# Patient Record
Sex: Male | Born: 1945 | Race: White | Hispanic: No | Marital: Married | Smoking: Former smoker
Health system: Southern US, Community
[De-identification: ages and names within clinical notes are randomized; demographics above are authoritative.]

## PROBLEM LIST (undated history)

## (undated) DIAGNOSIS — M1990 Unspecified osteoarthritis, unspecified site: Secondary | ICD-10-CM

## (undated) DIAGNOSIS — N390 Urinary tract infection, site not specified: Secondary | ICD-10-CM

## (undated) DIAGNOSIS — N401 Enlarged prostate with lower urinary tract symptoms: Secondary | ICD-10-CM

## (undated) DIAGNOSIS — D696 Thrombocytopenia, unspecified: Secondary | ICD-10-CM

## (undated) DIAGNOSIS — R338 Other retention of urine: Secondary | ICD-10-CM

## (undated) DIAGNOSIS — T7840XA Allergy, unspecified, initial encounter: Secondary | ICD-10-CM

## (undated) HISTORY — PX: VASECTOMY: SHX75

## (undated) HISTORY — PX: HERNIA REPAIR: SHX51

## (undated) HISTORY — PX: CYSTOSCOPY: SHX5120

## (undated) HISTORY — DX: Allergy, unspecified, initial encounter: T78.40XA

## (undated) HISTORY — PX: TONSILLECTOMY: SUR1361

---

## 2012-01-28 NOTE — H&P (Signed)
Joshua Golden DOB: 02/11/1946   Chief Complaint: right shoulder pain  History of Present Illness The patient is a 66 year old male who is scheduled for a right rotator cuff repair with Dr. Darrelyn Hillock on Wednesday February 05, 2012. He reportspain at the right shoulder that began 2 month ago . The patient reports that the shoulder symptoms began following a specific injury. The injury resulted from a fall onto the shoulder (backwards after slipping on steps). He was working in his house when he fell back on to his shoulders after slipping on some steps.The patient reports symptoms which include shoulder pain, decreased range of motion, night pain and inability to lay on that side. The patient reports symptoms that radiate to the right upper arm. The patient describes these symptoms as moderate in severity. Symptoms are exacerbated by motion at the shoulder and lifting. MRI showed full thickness tear of the subscapularis and supraspinatus tendons.   Allergies No Known Drug Allergies. 01/15/2012   Family History Drug / Alcohol Addiction. father Rheumatoid Arthritis. mother   Social History Alcohol use. current drinker; drinks beer and wine; only occasionally per week Children. 4 Current work status. retired Financial planner (Currently). no Drug/Alcohol Rehab (Previously). no Exercise. Exercises weekly; does running / walking Illicit drug use. no Living situation. live with spouse Marital status. married Number of flights of stairs before winded. greater than 5 Pain Contract. no Tobacco / smoke exposure. no Tobacco use. former smoker; smoke(d) less than 1/2 pack(s) per day   Medication History No Current Medications.   Past Surgical History Inguinal Hernia Repair. open: left   Review of Systems General:Not Present- Chills, Fever, Night Sweats, Appetite Loss, Fatigue, Feeling sick, Weight Gain and Weight Loss. Skin:Not Present- Itching,  Rash, Skin Color Changes, Ulcer, Psoriasis and Change in Hair or Nails. HEENT:Not Present- Sensitivity to light, Hearing problems, Nose Bleed and Ringing in the Ears. Neck:Not Present- Swollen Glands and Neck Mass. Respiratory:Not Present- Snoring, Chronic Cough, Bloody sputum and Dyspnea. Cardiovascular:Not Present- Shortness of Breath, Chest Pain, Swelling of Extremities, Leg Cramps and Palpitations. Gastrointestinal:Not Present- Bloody Stool, Heartburn, Abdominal Pain, Vomiting, Nausea and Incontinence of Stool. Male Genitourinary:Not Present- Blood in Urine, Frequency, Incontinence and Nocturia. Musculoskeletal:Not Present- Muscle Weakness, Muscle Pain, Joint Stiffness, Joint Swelling, Joint Pain and Back Pain. Neurological:Present- Tingling and Numbness. Not Present- Burning, Tremor, Headaches and Dizziness. Psychiatric:Not Present- Anxiety, Depression and Memory Loss. Endocrine:Not Present- Cold Intolerance, Heat Intolerance, Excessive hunger and Excessive Thirst. Hematology:Not Present- Abnormal Bleeding, Anemia, Blood Clots and Easy Bruising.   Vitals 01/15/2012 3:01 PM Weight: 181 lb Height: 68 in Body Surface Area: 1.98 m Body Mass Index: 27.52 kg/m Pulse: 84 (Regular) BP: 171/97 (Sitting, Left Arm, Standard)   Physical Exam On exam, he is alert and oriented x3. He is in no acute distress. There is tenderness to palpation over the anterior portion of the shoulder. The [facility] joint is intact. There are no masses or tumors palpated. He does have pain with motion, flexion and abduction of his right shoulder. He is unable to flex his right shoulder above about 45 degrees. The same with abduction. He is not able to hold his right arm up against resistance. The left shoulder has full painless range of motion and normal strength. Sensation and circulation is intact in the upper extremities.heart sounds normal. No murmurs. Lungs clear to auscultation. Abdomen soft  and nontender. Bowel sounds active. Neck supple, no bruit. EOM intact. Neuro grossly intact.   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    Assessment & Plan Rotator Cuff Tear Open rotator cuff repair, right shoulder

## 2012-01-30 ENCOUNTER — Encounter (HOSPITAL_COMMUNITY): Payer: Self-pay | Admitting: Pharmacy Technician

## 2012-02-03 ENCOUNTER — Encounter (HOSPITAL_COMMUNITY): Payer: Self-pay

## 2012-02-03 ENCOUNTER — Encounter (HOSPITAL_COMMUNITY)
Admission: RE | Admit: 2012-02-03 | Discharge: 2012-02-03 | Disposition: A | Discharge status: Home / Self Care | Payer: BC Managed Care – PPO | Source: Ambulatory Visit | Attending: Orthopedic Surgery | Admitting: Orthopedic Surgery

## 2012-02-03 HISTORY — DX: Thrombocytopenia, unspecified: D69.6

## 2012-02-03 LAB — COMPREHENSIVE METABOLIC PANEL
ALT: 10 U/L (ref 0–53)
AST: 17 U/L (ref 0–37)
Albumin: 4 g/dL (ref 3.5–5.2)
Alkaline Phosphatase: 67 U/L (ref 39–117)
BUN: 15 mg/dL (ref 6–23)
CO2: 27 mEq/L (ref 19–32)
Calcium: 9.5 mg/dL (ref 8.4–10.5)
Chloride: 105 mEq/L (ref 96–112)
Creatinine, Ser: 1.15 mg/dL (ref 0.50–1.35)
GFR calc Af Amer: 76 mL/min — ABNORMAL LOW (ref >90)
GFR calc non Af Amer: 65 mL/min — ABNORMAL LOW (ref >90)
Glucose, Bld: 88 mg/dL (ref 70–99)
Potassium: 4.4 mEq/L (ref 3.5–5.1)
Sodium: 139 mEq/L (ref 135–145)
Total Bilirubin: 0.4 mg/dL (ref 0.3–1.2)
Total Protein: 7.5 g/dL (ref 6.0–8.3)

## 2012-02-03 LAB — PROTIME-INR
INR: 0.91 (ref 0.00–1.49)
Prothrombin Time: 12.4 seconds (ref 11.6–15.2)

## 2012-02-03 LAB — URINALYSIS, ROUTINE W REFLEX MICROSCOPIC
Bilirubin Urine: NEGATIVE
Glucose, UA: NEGATIVE mg/dL
Hgb urine dipstick: NEGATIVE
Ketones, ur: NEGATIVE mg/dL
Leukocytes, UA: NEGATIVE
Nitrite: NEGATIVE
Protein, ur: NEGATIVE mg/dL
Specific Gravity, Urine: 1.023 (ref 1.005–1.030)
Urobilinogen, UA: 0.2 mg/dL (ref 0.0–1.0)
pH: 6 (ref 5.0–8.0)

## 2012-02-03 LAB — CBC
HCT: 47.5 % (ref 39.0–52.0)
Hemoglobin: 16.8 g/dL (ref 13.0–17.0)
MCH: 30.4 pg (ref 26.0–34.0)
MCHC: 35.4 g/dL (ref 30.0–36.0)
MCV: 85.9 fL (ref 78.0–100.0)
Platelets: 180 10*3/uL (ref 150–400)
RBC: 5.53 MIL/uL (ref 4.22–5.81)
RDW: 12.6 % (ref 11.5–15.5)
WBC: 6.4 10*3/uL (ref 4.0–10.5)

## 2012-02-03 LAB — DIFFERENTIAL
Basophils Absolute: 0 10*3/uL (ref 0.0–0.1)
Basophils Relative: 0 % (ref 0–1)
Eosinophils Absolute: 0.1 10*3/uL (ref 0.0–0.7)
Eosinophils Relative: 1 % (ref 0–5)
Lymphocytes Relative: 37 % (ref 12–46)
Lymphs Abs: 2.4 10*3/uL (ref 0.7–4.0)
Monocytes Absolute: 0.5 10*3/uL (ref 0.1–1.0)
Monocytes Relative: 8 % (ref 3–12)
Neutro Abs: 3.5 10*3/uL (ref 1.7–7.7)
Neutrophils Relative %: 54 % (ref 43–77)

## 2012-02-03 LAB — APTT: aPTT: 35 seconds (ref 24–37)

## 2012-02-03 LAB — SURGICAL PCR SCREEN: MRSA, PCR: NEGATIVE

## 2012-02-03 MED ORDER — CEFAZOLIN SODIUM 1-5 GM-% IV SOLN: 1.0000 g | INTRAVENOUS | Status: DC

## 2012-02-03 NOTE — Patient Instructions (Addendum)
20 Joshua Golden  02/03/2012   Your procedure is scheduled on:  02/05/12 100pm-236pm  Report to [facility] at 1100 AM.  Call this number if you have problems the morning of surgery: [phone]   Remember:   Do not eat food:After Midnight.  May have clear liquids:0700 am then npo     Take these medicines the morning of surgery with A SIP OF WATER:    Do not wear jewelry,   Do not wear lotions, powders, or perfumes.   .  Do not bring valuables to the hospital.  Contacts, dentures or bridgework may not be worn into surgery.  Leave suitcase in the car. After surgery it may be brought to your room.  For patients admitted to the hospital, checkout time is 11:00 AM the day of discharge.    :   Special Instructions: CHG Shower Use Special Wash: 1/2 bottle night before surgery and 1/2 bottle morning of surgery. shower chin to toes with CHG.  Wash face and private parts with regular soap.    Please read over the following fact sheets that you were given: MRSA Information, coughing and deep breathing exercises, leg exercises , Blood Transfusion Fact Sheet, Incentive Spirometry Fact Sheet

## 2012-02-05 ENCOUNTER — Encounter (HOSPITAL_COMMUNITY): Payer: Self-pay | Admitting: Anesthesiology

## 2012-02-05 ENCOUNTER — Encounter (HOSPITAL_COMMUNITY)
Admission: RE | Disposition: A | Discharge status: Home / Self Care | Payer: Self-pay | Source: Ambulatory Visit | Attending: Orthopedic Surgery

## 2012-02-05 ENCOUNTER — Encounter (HOSPITAL_COMMUNITY): Payer: Self-pay | Admitting: *Deleted

## 2012-02-05 ENCOUNTER — Ambulatory Visit (HOSPITAL_COMMUNITY)
Admission: RE | Admit: 2012-02-05 | Discharge: 2012-02-06 | Disposition: A | Discharge status: Home / Self Care | Payer: BC Managed Care – PPO | Source: Ambulatory Visit | Attending: Orthopedic Surgery | Admitting: Orthopedic Surgery

## 2012-02-05 ENCOUNTER — Ambulatory Visit (HOSPITAL_COMMUNITY): Payer: BC Managed Care – PPO | Admitting: Anesthesiology

## 2012-02-05 DIAGNOSIS — S43429A Sprain of unspecified rotator cuff capsule, initial encounter: Secondary | ICD-10-CM | POA: Insufficient documentation

## 2012-02-05 DIAGNOSIS — Y93H9 Activity, other involving exterior property and land maintenance, building and construction: Secondary | ICD-10-CM | POA: Insufficient documentation

## 2012-02-05 DIAGNOSIS — Y92009 Unspecified place in unspecified non-institutional (private) residence as the place of occurrence of the external cause: Secondary | ICD-10-CM | POA: Insufficient documentation

## 2012-02-05 DIAGNOSIS — W19XXXA Unspecified fall, initial encounter: Secondary | ICD-10-CM | POA: Insufficient documentation

## 2012-02-05 DIAGNOSIS — M75120 Complete rotator cuff tear or rupture of unspecified shoulder, not specified as traumatic: Secondary | ICD-10-CM | POA: Diagnosis present

## 2012-02-05 DIAGNOSIS — Z9889 Other specified postprocedural states: Secondary | ICD-10-CM

## 2012-02-05 DIAGNOSIS — M25819 Other specified joint disorders, unspecified shoulder: Secondary | ICD-10-CM | POA: Insufficient documentation

## 2012-02-05 DIAGNOSIS — Z01812 Encounter for preprocedural laboratory examination: Secondary | ICD-10-CM | POA: Insufficient documentation

## 2012-02-05 HISTORY — PX: SHOULDER OPEN ROTATOR CUFF REPAIR: SHX2407

## 2012-02-05 LAB — TYPE AND SCREEN
ABO/RH(D): B POS
Antibody Screen: NEGATIVE

## 2012-02-05 LAB — ABO/RH: ABO/RH(D): B POS

## 2012-02-05 SURGERY — REPAIR, ROTATOR CUFF, OPEN
Anesthesia: General | Site: Shoulder | Laterality: Right | Wound class: Clean

## 2012-02-05 MED ORDER — KETOROLAC TROMETHAMINE 30 MG/ML IJ SOLN
INTRAMUSCULAR | Status: AC
  Filled 2012-02-05: qty 1

## 2012-02-05 MED ORDER — PROPOFOL 10 MG/ML IV EMUL
INTRAVENOUS | Status: DC | PRN
  Administered 2012-02-05: 140 mg via INTRAVENOUS

## 2012-02-05 MED ORDER — PROMETHAZINE HCL 25 MG/ML IJ SOLN: 6.2500 mg | INTRAMUSCULAR | Status: DC | PRN

## 2012-02-05 MED ORDER — ACETAMINOPHEN 325 MG PO TABS: 650.0000 mg | ORAL_TABLET | Freq: Four times a day (QID) | ORAL | Status: DC | PRN

## 2012-02-05 MED ORDER — THROMBIN 5000 UNITS EX SOLR
CUTANEOUS | Status: DC | PRN
  Administered 2012-02-05: 5000 [IU] via TOPICAL

## 2012-02-05 MED ORDER — CEFAZOLIN SODIUM-DEXTROSE 2-3 GM-% IV SOLR
2.0000 g | Freq: Once | INTRAVENOUS | Status: AC
  Administered 2012-02-05: 2 g via INTRAVENOUS

## 2012-02-05 MED ORDER — HYDROCODONE-ACETAMINOPHEN 5-325 MG PO TABS
1.0000 | ORAL_TABLET | ORAL | Status: DC | PRN
  Administered 2012-02-06 (×2): 2 via ORAL
  Filled 2012-02-05 (×2): qty 2

## 2012-02-05 MED ORDER — ONDANSETRON HCL 4 MG/2ML IJ SOLN
4.0000 mg | Freq: Four times a day (QID) | INTRAMUSCULAR | Status: DC | PRN
  Filled 2012-02-05: qty 2

## 2012-02-05 MED ORDER — CEFAZOLIN SODIUM 1-5 GM-% IV SOLN
1.0000 g | Freq: Four times a day (QID) | INTRAVENOUS | Status: AC
  Administered 2012-02-05 – 2012-02-06 (×3): 1 g via INTRAVENOUS
  Filled 2012-02-05 (×3): qty 50

## 2012-02-05 MED ORDER — HETASTARCH-ELECTROLYTES 6 % IV SOLN
INTRAVENOUS | Status: DC | PRN
  Administered 2012-02-05: 14:00:00 via INTRAVENOUS

## 2012-02-05 MED ORDER — SUCCINYLCHOLINE CHLORIDE 20 MG/ML IJ SOLN
INTRAMUSCULAR | Status: DC | PRN
  Administered 2012-02-05: 100 mg via INTRAVENOUS

## 2012-02-05 MED ORDER — HYDROMORPHONE HCL PF 1 MG/ML IJ SOLN
0.5000 mg | INTRAMUSCULAR | Status: DC | PRN
  Administered 2012-02-05 (×3): 1 mg via INTRAVENOUS
  Filled 2012-02-05 (×3): qty 1

## 2012-02-05 MED ORDER — PHENOL 1.4 % MT LIQD: 1.0000 | OROMUCOSAL | Status: DC | PRN

## 2012-02-05 MED ORDER — HYDROMORPHONE HCL PF 1 MG/ML IJ SOLN
0.2500 mg | INTRAMUSCULAR | Status: DC | PRN
  Administered 2012-02-05 (×2): 0.5 mg via INTRAVENOUS

## 2012-02-05 MED ORDER — METHOCARBAMOL 500 MG PO TABS
500.0000 mg | ORAL_TABLET | Freq: Four times a day (QID) | ORAL | Status: DC | PRN
  Administered 2012-02-05: 500 mg via ORAL
  Filled 2012-02-05: qty 1

## 2012-02-05 MED ORDER — ACETAMINOPHEN 650 MG RE SUPP: 650.0000 mg | Freq: Four times a day (QID) | RECTAL | Status: DC | PRN

## 2012-02-05 MED ORDER — HYDROMORPHONE HCL PF 1 MG/ML IJ SOLN
INTRAMUSCULAR | Status: DC | PRN
  Administered 2012-02-05 (×2): 1 mg via INTRAVENOUS

## 2012-02-05 MED ORDER — KETOROLAC TROMETHAMINE 30 MG/ML IJ SOLN
15.0000 mg | Freq: Once | INTRAMUSCULAR | Status: AC | PRN
  Administered 2012-02-05: 30 mg via INTRAVENOUS

## 2012-02-05 MED ORDER — CEFAZOLIN SODIUM-DEXTROSE 2-3 GM-% IV SOLR
INTRAVENOUS | Status: AC
  Filled 2012-02-05: qty 50

## 2012-02-05 MED ORDER — BACITRACIN-NEOMYCIN-POLYMYXIN 400-5-5000 EX OINT
TOPICAL_OINTMENT | CUTANEOUS | Status: AC
  Filled 2012-02-05: qty 1

## 2012-02-05 MED ORDER — METHOCARBAMOL 100 MG/ML IJ SOLN: 500.0000 mg | Freq: Four times a day (QID) | INTRAVENOUS | Status: DC | PRN

## 2012-02-05 MED ORDER — LIDOCAINE HCL (CARDIAC) 20 MG/ML IV SOLN
INTRAVENOUS | Status: DC | PRN
  Administered 2012-02-05: 80 mg via INTRAVENOUS

## 2012-02-05 MED ORDER — ONDANSETRON HCL 4 MG/2ML IJ SOLN
INTRAMUSCULAR | Status: DC | PRN
  Administered 2012-02-05: 4 mg via INTRAVENOUS

## 2012-02-05 MED ORDER — ACETAMINOPHEN 10 MG/ML IV SOLN
INTRAVENOUS | Status: AC
  Filled 2012-02-05: qty 100

## 2012-02-05 MED ORDER — SODIUM CHLORIDE 0.9 % IR SOLN
Status: DC | PRN
  Administered 2012-02-05: 14:00:00

## 2012-02-05 MED ORDER — THROMBIN 5000 UNITS EX SOLR
CUTANEOUS | Status: AC
  Filled 2012-02-05: qty 10000

## 2012-02-05 MED ORDER — ACETAMINOPHEN 10 MG/ML IV SOLN
INTRAVENOUS | Status: DC | PRN
  Administered 2012-02-05: 1000 mg via INTRAVENOUS

## 2012-02-05 MED ORDER — HYDROMORPHONE HCL PF 1 MG/ML IJ SOLN
INTRAMUSCULAR | Status: AC
  Administered 2012-02-05: 1 mg via INTRAVENOUS
  Filled 2012-02-05: qty 1

## 2012-02-05 MED ORDER — MIDAZOLAM HCL 5 MG/5ML IJ SOLN
INTRAMUSCULAR | Status: DC | PRN
  Administered 2012-02-05: 2 mg via INTRAVENOUS

## 2012-02-05 MED ORDER — ROCURONIUM BROMIDE 100 MG/10ML IV SOLN
INTRAVENOUS | Status: DC | PRN
  Administered 2012-02-05: 25 mg via INTRAVENOUS

## 2012-02-05 MED ORDER — LACTATED RINGERS IV SOLN
INTRAVENOUS | Status: DC
  Administered 2012-02-05: 1000 mL via INTRAVENOUS
  Administered 2012-02-05: 15:00:00 via INTRAVENOUS

## 2012-02-05 MED ORDER — MENTHOL 3 MG MT LOZG: 1.0000 | LOZENGE | OROMUCOSAL | Status: DC | PRN

## 2012-02-05 MED ORDER — PHENYLEPHRINE HCL 10 MG/ML IJ SOLN
INTRAMUSCULAR | Status: DC | PRN
  Administered 2012-02-05 (×3): 80 ug via INTRAVENOUS

## 2012-02-05 MED ORDER — LACTATED RINGERS IV SOLN
INTRAVENOUS | Status: DC
  Administered 2012-02-05: 19:00:00 via INTRAVENOUS

## 2012-02-05 MED ORDER — GLYCOPYRROLATE 0.2 MG/ML IJ SOLN
INTRAMUSCULAR | Status: DC | PRN
  Administered 2012-02-05: 0.2 mg via INTRAVENOUS

## 2012-02-05 MED ORDER — NEOSTIGMINE METHYLSULFATE 1 MG/ML IJ SOLN
INTRAMUSCULAR | Status: DC | PRN
  Administered 2012-02-05: 2 mg via INTRAVENOUS

## 2012-02-05 MED ORDER — LABETALOL HCL 5 MG/ML IV SOLN
5.0000 mg | INTRAVENOUS | Status: DC | PRN
  Administered 2012-02-05 (×2): 5 mg via INTRAVENOUS
  Filled 2012-02-05: qty 4

## 2012-02-05 MED ORDER — SUFENTANIL CITRATE 50 MCG/ML IV SOLN
INTRAVENOUS | Status: DC | PRN
  Administered 2012-02-05: 5 ug via INTRAVENOUS
  Administered 2012-02-05: 10 ug via INTRAVENOUS
  Administered 2012-02-05: 5 ug via INTRAVENOUS

## 2012-02-05 MED ORDER — DROPERIDOL 2.5 MG/ML IJ SOLN
INTRAMUSCULAR | Status: DC | PRN
  Administered 2012-02-05: 0.625 mg via INTRAVENOUS

## 2012-02-05 MED ORDER — ONDANSETRON HCL 4 MG PO TABS: 4.0000 mg | ORAL_TABLET | Freq: Four times a day (QID) | ORAL | Status: DC | PRN

## 2012-02-05 MED ORDER — BUPIVACAINE LIPOSOME 1.3 % IJ SUSP
20.0000 mL | Freq: Once | INTRAMUSCULAR | Status: AC
  Administered 2012-02-05: 20 mL
  Filled 2012-02-05: qty 20

## 2012-02-05 MED ORDER — OXYCODONE-ACETAMINOPHEN 5-325 MG PO TABS
1.0000 | ORAL_TABLET | ORAL | Status: DC | PRN
Start: 2012-02-05 — End: 2012-02-06
  Administered 2012-02-06: 2 via ORAL
  Filled 2012-02-05: qty 2

## 2012-02-05 SURGICAL SUPPLY — 53 items
ANCHOR PEEK ZIP 5.5 NDL NO2 (Orthopedic Implant) ×6 IMPLANT
BAG ZIPLOCK 12X15 (MISCELLANEOUS) ×2 IMPLANT
BLADE OSCILLATING/SAGITTAL (BLADE) ×1
BLADE SW THK.38XMED LNG THN (BLADE) ×1 IMPLANT
BNDG COHESIVE 6X5 TAN NS LF (GAUZE/BANDAGES/DRESSINGS) ×2 IMPLANT
BUR OVAL CARBIDE 4.0 (BURR) ×2 IMPLANT
CLEANER TIP ELECTROSURG 2X2 (MISCELLANEOUS) ×2 IMPLANT
CLOSURE STERI STRIP 1/2 X4 (GAUZE/BANDAGES/DRESSINGS) ×2 IMPLANT
CLOTH BEACON ORANGE TIMEOUT ST (SAFETY) ×2 IMPLANT
DRAPE POUCH INSTRU U-SHP 10X18 (DRAPES) ×2 IMPLANT
DRAPE UTILITY W/TAPE 26X15 (DRAPES) ×2 IMPLANT
DRSG EMULSION OIL 3X3 NADH (GAUZE/BANDAGES/DRESSINGS) ×2 IMPLANT
DRSG PAD ABDOMINAL 8X10 ST (GAUZE/BANDAGES/DRESSINGS) ×2 IMPLANT
DURAPREP 26ML APPLICATOR (WOUND CARE) ×2 IMPLANT
ELECT REM PT RETURN 9FT ADLT (ELECTROSURGICAL) ×2
ELECTRODE REM PT RTRN 9FT ADLT (ELECTROSURGICAL) ×1 IMPLANT
FLOSEAL 10ML (HEMOSTASIS) IMPLANT
GLOVE BIO SURGEON STRL SZ 6.5 (GLOVE) ×2 IMPLANT
GLOVE BIOGEL PI IND STRL 8 (GLOVE) ×1 IMPLANT
GLOVE BIOGEL PI IND STRL 8.5 (GLOVE) ×1 IMPLANT
GLOVE BIOGEL PI INDICATOR 8 (GLOVE) ×1
GLOVE BIOGEL PI INDICATOR 8.5 (GLOVE) ×1
GLOVE ECLIPSE 8.0 STRL XLNG CF (GLOVE) ×4 IMPLANT
GOWN PREVENTION PLUS LG XLONG (DISPOSABLE) ×4 IMPLANT
GOWN STRL REIN XL XLG (GOWN DISPOSABLE) ×4 IMPLANT
KIT BASIN OR (CUSTOM PROCEDURE TRAY) ×2 IMPLANT
MANIFOLD NEPTUNE II (INSTRUMENTS) ×2 IMPLANT
NDL SAFETY ECLIPSE 18X1.5 (NEEDLE) ×1 IMPLANT
NEEDLE HYPO 18GX1.5 SHARP (NEEDLE) ×1
NEEDLE MA TROC 1/2 (NEEDLE) IMPLANT
NS IRRIG 1000ML POUR BTL (IV SOLUTION) ×2 IMPLANT
PACK SHOULDER CUSTOM OPM052 (CUSTOM PROCEDURE TRAY) ×2 IMPLANT
PASSER SUT SWANSON 36MM LOOP (INSTRUMENTS) IMPLANT
PATCH TISSUE MEND 3X3CM (Orthopedic Implant) ×2 IMPLANT
POSITIONER SURGICAL ARM (MISCELLANEOUS) ×2 IMPLANT
SLING ARM IMMOBILIZER LRG (SOFTGOODS) ×2 IMPLANT
SPONGE GAUZE 4X4 FOR O.R. (GAUZE/BANDAGES/DRESSINGS) ×2 IMPLANT
SPONGE LAP 4X18 X RAY DECT (DISPOSABLE) ×2 IMPLANT
SPONGE SURGIFOAM ABS GEL 100 (HEMOSTASIS) ×2 IMPLANT
STAPLER VISISTAT 35W (STAPLE) ×2 IMPLANT
STRIP CLOSURE SKIN 1/2X4 (GAUZE/BANDAGES/DRESSINGS) ×2 IMPLANT
SUCTION FRAZIER 12FR DISP (SUCTIONS) ×2 IMPLANT
SUT BONE WAX W31G (SUTURE) ×2 IMPLANT
SUT ETHIBOND NAB CT1 #1 30IN (SUTURE) ×2 IMPLANT
SUT MNCRL AB 4-0 PS2 18 (SUTURE) ×2 IMPLANT
SUT VIC AB 0 CT1 27 (SUTURE) ×1
SUT VIC AB 0 CT1 27XBRD ANTBC (SUTURE) ×1 IMPLANT
SUT VIC AB 1 CT1 27 (SUTURE) ×2
SUT VIC AB 1 CT1 27XBRD ANTBC (SUTURE) ×2 IMPLANT
SUT VIC AB 2-0 CT1 27 (SUTURE)
SUT VIC AB 2-0 CT1 27XBRD (SUTURE) IMPLANT
SYR 20CC LL (SYRINGE) ×2 IMPLANT
TOWEL OR 17X26 10 PK STRL BLUE (TOWEL DISPOSABLE) ×4 IMPLANT

## 2012-02-05 NOTE — Brief Op Note (Signed)
02/05/2012  2:39 PM  PATIENT:  Joshua Golden  66 y.o. male  PRE-OPERATIVE DIAGNOSIS:  right shoulder rotator cuff tear  POST-OPERATIVE DIAGNOSIS:  right shoulder rotator cuff tear  PROCEDURE:  Procedure(s) (LRB): ROTATOR CUFF REPAIR SHOULDER OPEN (Right)  SURGEON:  Surgeon(s) and Role:    * Jacki Cones, MD - Primary  PHYSICIAN ASSISTANT: Dimitri Ped PA     ANESTHESIA:   general  EBL:  Total I/O In: 1500 [I.V.:1000; IV Piggyback:500] Out: -   BLOOD ADMINISTERED:none  DRAINS: none   LOCAL MEDICATIONS USED:  BUPIVICAINE 20cc   SPECIMEN:  No Specimen  DISPOSITION OF SPECIMEN:  N/A  COUNTS:  YES  TOURNIQUET:  * No tourniquets in log *  DICTATION: .Other Dictation: Dictation Number [id number]  PLAN OF CARE: Admit for overnight observation  PATIENT DISPOSITION:  PACU - hemodynamically stable.   Delay start of Pharmacological VTE agent (>24hrs) due to surgical blood loss or risk of bleeding: yes

## 2012-02-05 NOTE — Interval H&P Note (Signed)
History and Physical Interval Note:  02/05/2012 12:44 PM  Joshua Golden  has presented today for surgery, with the diagnosis of right shoulder rotator cuff tear  The various methods of treatment have been discussed with the patient and family. After consideration of risks, benefits and other options for treatment, the patient has consented to  Procedure(s) (LRB): ROTATOR CUFF REPAIR SHOULDER OPEN (Right) as a surgical intervention .  The patients' history has been reviewed, patient examined, no change in status, stable for surgery.  I have reviewed the patients' chart and labs.  Questions were answered to the patient's satisfaction.     Katerina Zurn A

## 2012-02-05 NOTE — Anesthesia Postprocedure Evaluation (Signed)
  Anesthesia Post-op Note  Patient: Joshua Golden  Procedure(s) Performed: Procedure(s) (LRB): ROTATOR CUFF REPAIR SHOULDER OPEN (Right)  Patient Location: PACU  Anesthesia Type: General  Level of Consciousness: awake and alert   Airway and Oxygen Therapy: Patient Spontanous Breathing  Post-op Pain: mild  Post-op Assessment: Post-op Vital signs reviewed, Patient's Cardiovascular Status Stable, Respiratory Function Stable, Patent Airway and No signs of Nausea or vomiting  Post-op Vital Signs: stable  Complications: No apparent anesthesia complications

## 2012-02-05 NOTE — Transfer of Care (Signed)
Immediate Anesthesia Transfer of Care Note  Patient: Joshua Golden  Procedure(s) Performed: Procedure(s) (LRB): ROTATOR CUFF REPAIR SHOULDER OPEN (Right)  Patient Location: PACU  Anesthesia Type: General  Level of Consciousness: awake and patient cooperative  Airway & Oxygen Therapy: Patient Spontanous Breathing and Patient connected to face mask oxygen  Post-op Assessment: Report given to PACU RN and Post -op Vital signs reviewed and stable  Post vital signs: stable  Complications: No apparent anesthesia complications

## 2012-02-05 NOTE — Anesthesia Preprocedure Evaluation (Signed)
Anesthesia Evaluation  Patient identified by MRN, date of birth, ID band Patient awake    Reviewed: Allergy & Precautions, H&P , NPO status , Patient's Chart, lab work & pertinent test results  Airway Mallampati: II TM Distance: <3 FB Neck ROM: Full    Dental No notable dental hx.    Pulmonary neg pulmonary ROS,  clear to auscultation  Pulmonary exam normal       Cardiovascular neg cardio ROS Regular Normal    Neuro/Psych Negative Neurological ROS  Negative Psych ROS   GI/Hepatic negative GI ROS, Neg liver ROS,   Endo/Other  Negative Endocrine ROS  Renal/GU negative Renal ROS  Genitourinary negative   Musculoskeletal negative musculoskeletal ROS (+)   Abdominal   Peds negative pediatric ROS (+)  Hematology negative hematology ROS (+)   Anesthesia Other Findings   Reproductive/Obstetrics negative OB ROS                           Anesthesia Physical Anesthesia Plan  ASA: I  Anesthesia Plan: General   Post-op Pain Management:    Induction: Intravenous  Airway Management Planned: Oral ETT  Additional Equipment:   Intra-op Plan:   Post-operative Plan: Extubation in OR  Informed Consent: I have reviewed the patients History and Physical, chart, labs and discussed the procedure including the risks, benefits and alternatives for the proposed anesthesia with the patient or authorized representative who has indicated his/her understanding and acceptance.   Dental advisory given  Plan Discussed with:   Anesthesia Plan Comments:         Anesthesia Quick Evaluation

## 2012-02-05 NOTE — Interval H&P Note (Signed)
History and Physical Interval Note:  02/05/2012 12:42 PM  Joshua Golden  has presented today for surgery, with the diagnosis of right shoulder rotator cuff tear  The various methods of treatment have been discussed with the patient and family. After consideration of risks, benefits and other options for treatment, the patient has consented to  Procedure(s) (LRB): ROTATOR CUFF REPAIR SHOULDER OPEN (Right) as a surgical intervention .  The patients' history has been reviewed, patient examined, no change in status, stable for surgery.  I have reviewed the patients' chart and labs.  Questions were answered to the patient's satisfaction.     Kiefer Opheim A

## 2012-02-06 ENCOUNTER — Encounter (HOSPITAL_COMMUNITY): Payer: Self-pay | Admitting: *Deleted

## 2012-02-06 DIAGNOSIS — Z9889 Other specified postprocedural states: Secondary | ICD-10-CM

## 2012-02-06 MED ORDER — METHOCARBAMOL 500 MG PO TABS: 500.0000 mg | ORAL_TABLET | Freq: Four times a day (QID) | ORAL | Status: AC | PRN

## 2012-02-06 MED ORDER — OXYCODONE-ACETAMINOPHEN 5-325 MG PO TABS: 1.0000 | ORAL_TABLET | ORAL | Status: AC | PRN

## 2012-02-06 MED ORDER — BIOTENE DRY MOUTH MT LIQD
15.0000 mL | Freq: Two times a day (BID) | OROMUCOSAL | Status: DC
  Administered 2012-02-06: 15 mL via OROMUCOSAL

## 2012-02-06 MED ORDER — HYDROMORPHONE HCL PF 1 MG/ML IJ SOLN
1.0000 mg | INTRAMUSCULAR | Status: DC | PRN
  Administered 2012-02-06 (×4): 1 mg via INTRAVENOUS
  Filled 2012-02-06 (×3): qty 1

## 2012-02-06 MED ORDER — TAMSULOSIN HCL 0.4 MG PO CAPS
0.4000 mg | ORAL_CAPSULE | Freq: Once | ORAL | Status: AC
  Administered 2012-02-06: 0.4 mg via ORAL
  Filled 2012-02-06: qty 1

## 2012-02-06 NOTE — Op Note (Signed)
Joshua Golden, MATNEY NO.:  [id number]  MEDICAL RECORD NO.:  [id number]  LOCATION:  1337                         FACILITY:  [facility]  PHYSICIAN:  Georges Lynch. Ladasia Sircy, M.D.DATE OF BIRTH:  [birth year]  DATE OF PROCEDURE:  02/05/2012 DATE OF DISCHARGE:                              OPERATIVE REPORT   SURGEON:  Georges Lynch. Darrelyn Hillock, M.D.  ASSISTANT:  Dimitri Ped, [place]  PREOPERATIVE DIAGNOSES: 1. Severe impingement syndrome, right shoulder. 2. Complex __________ retracted complete tear of the rotator cuff     tendon, right shoulder.  POSTOPERATIVE DIAGNOSES: 1. Severe impingement syndrome, right shoulder. 2. Complex __________ retracted complete tear of the rotator cuff     tendon, right shoulder.  OPERATION: 1. Open acromionectomy and acromioplasty, right shoulder. 2. Excision of the subdeltoid bursa, right shoulder. 3. Repair of a complete retracted tear of the rotator cuff utilizing 3     Stryker anchors, and also a TissueMend graft.  PROCEDURE:  Under general anesthesia, routine orthopedic prepping and draping of the right shoulder was carried out.  The appropriate time-out was carried out first.  Prior to that, I marked the appropriate right arm in the holding area.  After sterile prepping and draping was done with the patient on the beach chair position, he had 2 g of IV Ancef. An incision was made over the anterior aspect of the right shoulder. Bleeders identified and cauterized.  At this time, the proximal deltoid tendon was partially stripped.  From the acromion, I split a small portion of the deltoid muscle as well.  At this time, I went down and identified the biceps tendon that was intact.  Also noted a complete retracted tear of the right rotator cuff tendon.  After we did the acromionectomy and acromioplasty utilizing oscillating saw and the bur, I then bone waxed the undersurface of the acromion.  Following that, I thoroughly irrigated out the area.   I then burred the lateral articular surface of the humerus.  I then irrigated it again.  Three anchors were placed in the proximal humerus.  Two of those anchors were utilized to anchor down the rotator cuff.  The rotator cuff tendon was brought forward with Kocher clamps in place.  I then utilized the gloved finger to break up all the adhesions in the subacromial space to further free up the tendon.  At this point, I then sutured the tendon down in usual fashion.  The third anchor was used then to help anchor the TissueMend graft.  The TissueMend graft then was sewed to the remaining tendon to cover the lateral defect.  I then anchored that down distally.  This time, we then injected 20 cc of Exparel into the wound site.  I then closed the wound in layers in usual fashion.  The deltoid tendon muscle was first reapproximated, then we reapproximated subcontinuous layer, then utilized 4-0 Monocryl sutures subcu.  Sterile dressing was applied.  The patient was placed in a shoulder immobilizer.          ______________________________ Georges Lynch Darrelyn Hillock, M.D.     RAG/MEDQ  D:  02/05/2012  T:  02/06/2012  Job:  [id number]

## 2012-02-06 NOTE — Progress Notes (Signed)
Subjective: Pain is under better control this A.M. Plan on OT to evaluate.  Objective: Vital signs in last 24 hours: Temp:  [97.5 F (36.4 C)-98.6 F (37 C)] 98.6 F (37 C) (02/28 0520) Pulse Rate:  [73-98] 87  (02/28 0520) Resp:  [9-20] 20  (02/28 0520) BP: (151-190)/(69-99) 155/81 mmHg (02/28 0520) SpO2:  [93 %-99 %] 96 % (02/28 0520) Weight:  [83.054 kg (183 lb 1.6 oz)] 83.054 kg (183 lb 1.6 oz) (02/28 0205)  Intake/Output from previous day: 02/27 0701 - 02/28 0700 In: 2200 [I.V.:1700; IV Piggyback:500] Out: 2275 [Urine:2225; Blood:50] Intake/Output this shift:     Basename 02/03/12 0920  HGB 16.8    Basename 02/03/12 0920  WBC 6.4  RBC 5.53  HCT 47.5  PLT 180    Basename 02/03/12 0920  NA 139  K 4.4  CL 105  CO2 27  BUN 15  CREATININE 1.15  GLUCOSE 88  CALCIUM 9.5    Basename 02/03/12 0920  LABPT --  INR 0.91    Neurologically intact  Assessment/Plan: DC Foley and plan on DC today.   Taelyn Nemes A 02/06/2012, 7:31 AM

## 2012-02-06 NOTE — Evaluation (Signed)
Occupational Therapy Evaluation Patient Details Name: Joshua Golden MRN: [id number] DOB: 01/03/1946 Today's Date: 02/06/2012  Problem List:  Patient Active Problem List  Diagnoses  . Complete rotator cuff tear or rupture of shoulder  . S/P rotator cuff repair    Past Medical History:  Past Medical History  Diagnosis Date  . 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 Hernia repair     left inguinal hernia repair   OT Evaluation Written OT evaluation completed and filed in shadow chart. Please see for details, POC and recommended f/u w/ MD. No further OT needs at this time, will sign off, thank you for this referral.    Alm Bustard 02/06/2012, 9:15 AM

## 2012-02-06 NOTE — Discharge Summary (Signed)
Physician Discharge Summary   Patient ID: Joshua Golden MRN: [id number] DOB/AGE: 65-Nov-1947 65 y.o.  Admit date: 02/05/2012 Discharge date: 02/06/2012  Primary Diagnosis: Right rotator cuff tear  Admission Diagnoses: Past Medical History  Diagnosis Date  . 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 Jeannetta Ellis office with the chief complaint of right shoulder pain. He reported pain at the right shoulder that began 2 month ago . The patient reported that the shoulder symptoms began following a specific injury. The injury resulted from a fall onto the shoulder (backwards after slipping on steps). He was working in his house when he fell back on to his shoulders after slipping on some steps. The patient reported symptoms which include shoulder pain, decreased range of motion, night pain and inability to lay on that side. The patient reported symptoms that radiate to the right upper arm. Symptoms exacerbated by motion at the shoulder and lifting. MRI showed full thickness tear of the subscapularis and supraspinatus tendons.     Laboratory Data: Hospital Outpatient Visit on 02/03/2012  Component Date Value Range Status  . aPTT (seconds) 02/03/2012 35  24-37 Final  . WBC (K/uL) 02/03/2012 6.4  4.0-10.5 Final  . RBC (MIL/uL) 02/03/2012 5.53  4.22-5.81 Final  . Hemoglobin (g/dL) 40/98/1191 47.8  29.5-62.1 Final  . HCT (%) 02/03/2012 47.5  39.0-52.0 Final  . MCV (fL) 02/03/2012 85.9  78.0-100.0 Final  . MCH (pg) 02/03/2012 30.4  26.0-34.0 Final  . MCHC (g/dL) 30/86/5784 69.6  29.5-28.4 Final  . RDW (%) 02/03/2012 12.6  11.5-15.5 Final  . Platelets (K/uL) 02/03/2012 180  150-400 Final  . Sodium (mEq/L) 02/03/2012 139  135-145  Final  . Potassium (mEq/L) 02/03/2012 4.4  3.5-5.1 Final  . Chloride (mEq/L) 02/03/2012 105  96-112 Final  . CO2 (mEq/L) 02/03/2012 27  19-32 Final  . Glucose, Bld (mg/dL) 13/24/4010 88  27-25 Final  . BUN (mg/dL) 36/64/4034 15  7-42 Final  . Creatinine, Ser (mg/dL) 59/56/3875 6.43  3.29-5.18 Final  . Calcium (mg/dL) 84/16/6063 9.5  0.1-60.1 Final  . Total Protein (g/dL) 09/32/3557 7.5  3.2-2.0 Final  . Albumin (g/dL) 25/42/7062 4.0  3.7-6.2 Final  . AST (U/L) 02/03/2012 17  0-37 Final  . ALT (U/L) 02/03/2012 10  0-53 Final  . Alkaline Phosphatase (U/L) 02/03/2012 67  39-117 Final  . Total Bilirubin (mg/dL) 83/15/1761 0.4  6.0-7.3 Final  . GFR calc non Af Amer (mL/min) 02/03/2012 65* >90 Final  . GFR calc Af Amer (mL/min) 02/03/2012 76* >90 Final   Comment:                                 The eGFR has been calculated                          using the CKD EPI equation.                          This calculation has not been  validated in all clinical                          situations.                          eGFR's persistently                          <90 mL/min signify                          possible Chronic Kidney Disease.  Marland Kitchen Neutrophils Relative (%) 02/03/2012 54  43-77 Final  . Neutro Abs (K/uL) 02/03/2012 3.5  1.7-7.7 Final  . Lymphocytes Relative (%) 02/03/2012 37  12-46 Final  . Lymphs Abs (K/uL) 02/03/2012 2.4  0.7-4.0 Final  . Monocytes Relative (%) 02/03/2012 8  3-12 Final  . Monocytes Absolute (K/uL) 02/03/2012 0.5  0.1-1.0 Final  . Eosinophils Relative (%) 02/03/2012 1  0-5 Final  . Eosinophils Absolute (K/uL) 02/03/2012 0.1  0.0-0.7 Final  . Basophils Relative (%) 02/03/2012 0  0-1 Final  . Basophils Absolute (K/uL) 02/03/2012 0.0  0.0-0.1 Final  . Prothrombin Time (seconds) 02/03/2012 12.4  11.6-15.2 Final  . INR  02/03/2012 0.91  0.00-1.49 Final  . Color, Urine  02/03/2012 YELLOW  YELLOW Final  . APPearance  02/03/2012 CLEAR  CLEAR  Final  . Specific Gravity, Urine  02/03/2012 1.023  1.005-1.030 Final  . pH  02/03/2012 6.0  5.0-8.0 Final  . Glucose, UA (mg/dL) 16/09/9603 NEGATIVE  NEGATIVE Final  . Hgb urine dipstick  02/03/2012 NEGATIVE  NEGATIVE Final  . Bilirubin Urine  02/03/2012 NEGATIVE  NEGATIVE Final  . Ketones, ur (mg/dL) 54/08/8118 NEGATIVE  NEGATIVE Final  . Protein, ur (mg/dL) 14/78/2956 NEGATIVE  NEGATIVE Final  . Urobilinogen, UA (mg/dL) 21/30/8657 0.2  8.4-6.9 Final  . Nitrite  02/03/2012 NEGATIVE  NEGATIVE Final  . Leukocytes, UA  02/03/2012 NEGATIVE  NEGATIVE Final   MICROSCOPIC NOT DONE ON URINES WITH NEGATIVE PROTEIN, BLOOD, LEUKOCYTES, NITRITE, OR GLUCOSE <1000 mg/dL.  Marland Kitchen MRSA, PCR  02/03/2012 NEGATIVE  NEGATIVE Final  . Staphylococcus aureus  02/03/2012 NEGATIVE  NEGATIVE Final   Comment:                                 The Xpert SA Assay (FDA                          approved for NASAL specimens                          only), is one component of                          a comprehensive surveillance                          program.  It is not intended                          to diagnose infection nor to  guide or monitor treatment.    Basename 02/03/12 0920  HGB 16.8    Basename 02/03/12 0920  WBC 6.4  RBC 5.53  HCT 47.5  PLT 180    Basename 02/03/12 0920  NA 139  K 4.4  CL 105  CO2 27  BUN 15  CREATININE 1.15  GLUCOSE 88  CALCIUM 9.5    Basename 02/03/12 0920  LABPT --  INR 0.91    Hospital Course: Patient was admitted to [facility] and taken to the OR and underwent the above state procedure without complications.  Patient tolerated the procedure well and was later transferred to the recovery room and then to the floor for postoperative care.  They were given PO and IV analgesics for pain control following their surgery. OT was ordered per rotator cuff repair protocol.  Discharge planning consulted to help with postop disposition and  equipment needs.  Patient had a rough night on the evening of surgery due to pain. On post op day one, he was feeling much better with better pain control.  Patient was seen in rounds and was ready to go home after meeting with OT.    Discharge Medications: Prior to Admission medications   Medication Sig Start Date End Date Taking? Authorizing Provider  naproxen sodium (ANAPROX) 220 MG tablet Take 220 mg by mouth 2 (two) times daily as needed. FOR PAIN   Yes Historical Provider, MD  methocarbamol (ROBAXIN) 500 MG tablet Take 1 tablet (500 mg total) by mouth every 6 (six) hours as needed. 02/06/12 02/16/12  Trentyn Boisclair Tamala Ser, PA  oxyCODONE-acetaminophen (PERCOCET) 5-325 MG per tablet Take 1-2 tablets by mouth every 4 (four) hours as needed. 02/06/12 02/16/12  Chrisanna Mishra Tamala Ser, PA    Diet: Heart healthy  Activity: Keep sling on right shoulder at all times  Follow-up: in 10 days  Disposition: Home  Discharged Condition: Good   Discharge Orders    Future Orders Please Complete By Expires   Diet - low sodium heart healthy      Call MD / Call 911      Comments:   If you experience chest pain or shortness of breath, CALL 911 and be transported to the hospital emergency room.  If you develope a fever above 101 F, pus (white drainage) or increased drainage or redness at the wound, or calf pain, call your surgeon's office.   Constipation Prevention      Comments:   Drink plenty of fluids.  Prune juice may be helpful.  You may use a stool softener, such as Colace (over the counter) 100 mg twice a day.  Use MiraLax (over the counter) for constipation as needed.   Discharge instructions      Comments:   Keep your sling on at all times, including sleeping in your sling.  The only time you should remove your sling is to shower only but you need to keep your hand against your chest while you shower.   For the first few days, remove your dressing, tape a piece of saran wrap over your  incision, take your shower, then remove the saran wrap and put a clean dressing on, then reapply your sling.  After two days you can shower without the saran wrap.   Call Dr. Darrelyn Hillock if any wound complications or temperature of 101 degrees F or over.   Call the office for an appointment to see Dr. Darrelyn Hillock in two weeks: [phone] and ask for Dr. Jeannetta Ellis nurse, Mackey Birchwood.  Driving restrictions      Comments:   No driving   Lifting restrictions      Comments:   No lifting     Medication List  As of 02/06/2012  7:25 AM   STOP taking these medications         HYDROcodone-acetaminophen 5-325 MG per tablet         TAKE these medications         methocarbamol 500 MG tablet   Commonly known as: ROBAXIN   Take 1 tablet (500 mg total) by mouth every 6 (six) hours as needed.      naproxen sodium 220 MG tablet   Commonly known as: ANAPROX   Take 220 mg by mouth 2 (two) times daily as needed. FOR PAIN      oxyCODONE-acetaminophen 5-325 MG per tablet   Commonly known as: PERCOCET   Take 1-2 tablets by mouth every 4 (four) hours as needed.             Signed: Cassondra Stachowski LAUREN 02/06/2012, 7:25 AM

## 2012-02-07 ENCOUNTER — Encounter (HOSPITAL_COMMUNITY): Payer: Self-pay | Admitting: Orthopedic Surgery

## 2012-08-15 ENCOUNTER — Ambulatory Visit (INDEPENDENT_AMBULATORY_CARE_PROVIDER_SITE_OTHER): Payer: Medicare Other | Admitting: Family Medicine

## 2012-08-15 VITALS — BP 132/78 | HR 91 | Temp 98.8°F | Resp 16 | Ht 67.0 in | Wt 174.0 lb

## 2012-08-15 DIAGNOSIS — J329 Chronic sinusitis, unspecified: Secondary | ICD-10-CM

## 2012-08-15 DIAGNOSIS — R05 Cough: Secondary | ICD-10-CM

## 2012-08-15 DIAGNOSIS — R059 Cough, unspecified: Secondary | ICD-10-CM

## 2012-08-15 MED ORDER — CEFDINIR 300 MG PO CAPS: 300.0000 mg | ORAL_CAPSULE | Freq: Two times a day (BID) | ORAL | Status: AC

## 2012-08-15 NOTE — Progress Notes (Signed)
[facility] [address] [place] [address] [phone]  Date:  08/15/2012   Name:  Joshua Golden   DOB:  [birth year]   MRN:  [id number]  PCP:  No primary provider on file.    Chief Complaint: Sinusitis, Cough and Chills   History of Present Illness:  Joshua Golden is a 66 y.o. very pleasant male patient who presents with the following:  He has noted progressive sinus symptoms for the last few weeks.  He has noted chills, sinus congestion, and ear pain.  He has noted a mild cough for a few weeks as well.  He has a runny nose, but no sneezing. No eye symptoms.   No GI symptoms.  He has not noted a fever, he did have some body aches earlier.    He is generally healthy.  He had a rotator cuff repair earlier this year which was successful. He does not take any chronic medications and has NKDA  Patient Active Problem List  Diagnosis  . Complete rotator cuff tear or rupture of shoulder  . S/P rotator cuff repair    Past Medical History  Diagnosis Date  . 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 Hernia repair     left inguinal hernia repair  . Shoulder open rotator cuff repair 02/05/2012    Procedure: ROTATOR CUFF REPAIR SHOULDER OPEN;  Surgeon: Jacki Cones, MD;  Location: [facility];  Service: Orthopedics;  Laterality: Right;    History  Substance Use Topics  . Smoking status: Former Games developer  . Smokeless tobacco: Never Used  . Alcohol Use:      occasional beer     No family history on file.  No Known Allergies  Medication list has been reviewed and updated.  Current Outpatient Prescriptions on File Prior to Visit  Medication Sig Dispense Refill  . naproxen sodium (ANAPROX) 220 MG tablet Take 220 mg by mouth 2 (two) times daily as needed. FOR PAIN        Review of Systems:  As per HPI- otherwise negative.   Physical Examination: Filed Vitals:   08/15/12 0816  BP: 132/78    Pulse: 91  Temp: 98.8 F (37.1 C)  Resp: 16   Filed Vitals:   08/15/12 0816  Height: 5\' 7"  (1.702 m)  Weight: 174 lb (78.926 kg)   Body mass index is 27.25 kg/(m^2). Ideal Body Weight: Weight in (lb) to have BMI = 25: 159.3   GEN: WDWN, NAD, Non-toxic, A & O x 3 HEENT: Atraumatic, Normocephalic. Neck supple. No masses, No LAD.  PEERL, EOMI, nasal cavity slightly inflamed but oropharynx normal. Frontal sinuses tender.   Ears and Nose: No external deformity. CV: RRR, No M/G/R. No JVD. No thrill. No extra heart sounds. PULM: CTA B, no wheezes, crackles, rhonchi. No retractions. No resp. distress. No accessory muscle use. EXTR: No c/c/e NEURO Normal gait.  PSYCH: Normally interactive. Conversant. Not depressed or anxious appearing.  Calm demeanor.    Assessment and Plan: 1. Sinusitis  cefdinir (OMNICEF) 300 MG capsule     COPLAND,JESSICA, MD

## 2014-04-05 ENCOUNTER — Ambulatory Visit (INDEPENDENT_AMBULATORY_CARE_PROVIDER_SITE_OTHER): Payer: Medicare Other | Admitting: Internal Medicine

## 2014-04-05 VITALS — BP 132/80 | HR 77 | Temp 98.2°F | Resp 14 | Ht 66.5 in | Wt 175.4 lb

## 2014-04-05 DIAGNOSIS — H6980 Other specified disorders of Eustachian tube, unspecified ear: Secondary | ICD-10-CM

## 2014-04-05 DIAGNOSIS — J019 Acute sinusitis, unspecified: Secondary | ICD-10-CM

## 2014-04-05 MED ORDER — AMOXICILLIN 875 MG PO TABS: 875.0000 mg | ORAL_TABLET | Freq: Two times a day (BID) | ORAL | Status: DC

## 2014-04-05 MED ORDER — TRIAMCINOLONE ACETONIDE 55 MCG/ACT NA AERO: 2.0000 | INHALATION_SPRAY | Freq: Every day | NASAL | Status: DC

## 2014-04-05 NOTE — Progress Notes (Signed)
Subjective:     Patient ID: Joshua Golden, male   DOB: 1946/03/09, 68 y.o.   MRN: [id number]  Cough Associated symptoms include a fever and rhinorrhea. Pertinent negatives include no chest pain, chills, sore throat or shortness of breath. His past medical history is significant for environmental allergies.   [age] yr male states his head is "stuffed up" has been going on for 3 days.  Believes he had a sinus infection last week.  Was not seen or tx.  States he has decreased hearing in both ears with the right side being worse.  The rt side is draining fluid for the previous 2 days.  Admits his ear drum has "burst" over 30 years ago not sure what side it was. Had ear infections in the past as child but never had tubes.    For sinus congestion pt using neddy pot and felt his ear pop 4 days ago while blowing nose.  Never seen ENT.    Review of Systems  Constitutional: Positive for fever. Negative for chills, activity change, appetite change and fatigue.  HENT: Positive for congestion and rhinorrhea. Negative for sneezing and sore throat.   Eyes: Negative for discharge and itching.  Respiratory: Negative for cough and shortness of breath.   Cardiovascular: Negative for chest pain and palpitations.  Gastrointestinal: Negative for nausea, abdominal pain and diarrhea.  Musculoskeletal: Negative for arthralgias.  Allergic/Immunologic: Positive for environmental allergies.       Objective:   Physical Exam  Constitutional: He is oriented to person, place, and time. He appears well-developed and well-nourished.  HENT:  Head: Normocephalic and atraumatic.  Right Ear: There is drainage. No swelling or tenderness. No foreign bodies. Tympanic membrane is perforated. Tympanic membrane is not retracted. Decreased hearing is noted.  Left Ear: No drainage, swelling or tenderness. No foreign bodies. Tympanic membrane is not retracted. Decreased hearing is noted.  Ears:  Nose: Right sinus exhibits no maxillary  sinus tenderness and no frontal sinus tenderness. Left sinus exhibits no maxillary sinus tenderness and no frontal sinus tenderness.  Mouth/Throat: Uvula is midline, oropharynx is clear and moist and mucous membranes are normal.  Hearing is symmetrical  Cardiovascular: Normal rate and regular rhythm.   Pulmonary/Chest: Effort normal and breath sounds normal.  Abdominal: Soft. Bowel sounds are normal.  Neurological: He is alert and oriented to person, place, and time.  Skin: Skin is warm and dry.   Filed Vitals:   04/05/14 1008  BP: 132/80  Pulse: 77  Temp: 98.2 F (36.8 C)  Resp: 14       Assessment:  1. Eustachian tube dysfunction 2. Tympanic membrane perforation-now healed 3. sinusitis    Plan:     1. Amoxicillin 875 mg BID x 10 days     2. nasocort  3. Hearing screen in 30d   I have completed the patient encounter in its entirety as documented by [facility], with editing by me where necessary. Sabastian Raimondi P. Merla Richesoolittle, M.D.

## 2015-02-28 ENCOUNTER — Encounter: Payer: Self-pay | Admitting: *Deleted

## 2015-04-20 ENCOUNTER — Encounter: Payer: Self-pay | Admitting: *Deleted

## 2015-05-26 ENCOUNTER — Encounter: Payer: Self-pay | Admitting: *Deleted

## 2015-11-14 ENCOUNTER — Telehealth: Payer: Self-pay | Admitting: Family Medicine

## 2015-11-14 NOTE — Telephone Encounter (Signed)
LEFT A MESSAGE FOR PATIENT TO CALL AND SCHEDULE HIS ANNUAL EXAM.  IF WE ARE NOT HIS PCP WHO IS?   WE NEED A CONGNITIVE FUNCTION, DEPRESSION SCREENING, PHYSICAL ACTIVITY DOCUMENTATION AND BMI IN QUALITY METRICS DOCUMENTED.  FOR OPTUM PAPERWORK FOR Tameka Hoiland.

## 2016-05-20 ENCOUNTER — Ambulatory Visit (INDEPENDENT_AMBULATORY_CARE_PROVIDER_SITE_OTHER): Payer: Medicare Other | Admitting: Emergency Medicine

## 2016-05-20 ENCOUNTER — Telehealth: Payer: Self-pay | Admitting: *Deleted

## 2016-05-20 VITALS — BP 162/78 | HR 84 | Temp 97.5°F | Resp 16 | Ht 66.5 in | Wt 176.0 lb

## 2016-05-20 DIAGNOSIS — N390 Urinary tract infection, site not specified: Secondary | ICD-10-CM

## 2016-05-20 DIAGNOSIS — Z1159 Encounter for screening for other viral diseases: Secondary | ICD-10-CM

## 2016-05-20 DIAGNOSIS — R350 Frequency of micturition: Secondary | ICD-10-CM

## 2016-05-20 DIAGNOSIS — R8281 Pyuria: Secondary | ICD-10-CM

## 2016-05-20 DIAGNOSIS — Z125 Encounter for screening for malignant neoplasm of prostate: Secondary | ICD-10-CM | POA: Diagnosis not present

## 2016-05-20 DIAGNOSIS — R300 Dysuria: Secondary | ICD-10-CM | POA: Diagnosis not present

## 2016-05-20 LAB — CBC WITH DIFFERENTIAL/PLATELET
BASOS PCT: 0 %
Basophils Absolute: 0 cells/uL (ref 0–200)
EOS ABS: 78 {cells}/uL (ref 15–500)
Eosinophils Relative: 1 %
HCT: 46.6 % (ref 38.5–50.0)
Hemoglobin: 16.2 g/dL (ref 13.2–17.1)
LYMPHS ABS: 1716 {cells}/uL (ref 850–3900)
Lymphocytes Relative: 22 %
MCH: 29.7 pg (ref 27.0–33.0)
MCHC: 34.8 g/dL (ref 32.0–36.0)
MCV: 85.5 fL (ref 80.0–100.0)
MONOS PCT: 11 %
MPV: 8.3 fL (ref 7.5–12.5)
Monocytes Absolute: 858 cells/uL (ref 200–950)
NEUTROS ABS: 5148 {cells}/uL (ref 1500–7800)
Neutrophils Relative %: 66 %
PLATELETS: 157 10*3/uL (ref 140–400)
RBC: 5.45 MIL/uL (ref 4.20–5.80)
RDW: 13.9 % (ref 11.0–15.0)
WBC: 7.8 10*3/uL (ref 3.8–10.8)

## 2016-05-20 LAB — POCT URINALYSIS DIP (MANUAL ENTRY)
BILIRUBIN UA: NEGATIVE
Bilirubin, UA: NEGATIVE
Glucose, UA: NEGATIVE
Nitrite, UA: NEGATIVE
PH UA: 6
SPEC GRAV UA: 1.025
Urobilinogen, UA: 0.2

## 2016-05-20 LAB — POC MICROSCOPIC URINALYSIS (UMFC): MUCUS RE: ABSENT

## 2016-05-20 LAB — COMPLETE METABOLIC PANEL WITH GFR
ALBUMIN: 4.6 g/dL (ref 3.6–5.1)
ALK PHOS: 78 U/L (ref 40–115)
ALT: 9 U/L (ref 9–46)
AST: 20 U/L (ref 10–35)
BILIRUBIN TOTAL: 0.6 mg/dL (ref 0.2–1.2)
BUN: 25 mg/dL (ref 7–25)
CHLORIDE: 108 mmol/L (ref 98–110)
CO2: 21 mmol/L (ref 20–31)
Calcium: 9.4 mg/dL (ref 8.6–10.3)
Creat: 1.46 mg/dL — ABNORMAL HIGH (ref 0.70–1.25)
GFR, Est African American: 56 mL/min — ABNORMAL LOW (ref >=60)
GFR, Est Non African American: 49 mL/min — ABNORMAL LOW (ref >=60)
GLUCOSE: 85 mg/dL (ref 65–99)
POTASSIUM: 4.5 mmol/L (ref 3.5–5.3)
SODIUM: 139 mmol/L (ref 135–146)
Total Protein: 7.2 g/dL (ref 6.1–8.1)

## 2016-05-20 LAB — HEPATITIS C ANTIBODY: HCV Ab: NEGATIVE

## 2016-05-20 LAB — PSA, MEDICARE: PSA: 4.06 ng/mL — AB (ref <=4.00)

## 2016-05-20 MED ORDER — TAMSULOSIN HCL 0.4 MG PO CAPS: 0.4000 mg | ORAL_CAPSULE | Freq: Every day | ORAL | Status: DC

## 2016-05-20 MED ORDER — CIPROFLOXACIN HCL 500 MG PO TABS: 500.0000 mg | ORAL_TABLET | Freq: Two times a day (BID) | ORAL | Status: DC

## 2016-05-20 MED ORDER — CEFTRIAXONE SODIUM 1 G IJ SOLR
1.0000 g | Freq: Once | INTRAMUSCULAR | Status: AC
  Administered 2016-05-20: 1 g via INTRAMUSCULAR

## 2016-05-20 NOTE — Telephone Encounter (Signed)
Faxed office notes to [facility] to Dr Annabell HowellsWrenn, patient has an appointment at 11:15 am. Conformation page received.

## 2016-05-20 NOTE — Progress Notes (Signed)
Subjective:  This chart was scribed for Lesle Chris MD, by Veverly Fells, at [facility].  This patient was seen in room 13 and the patient's care was started at 9:11 AM.   Chief Complaint  Patient presents with  . Urinary Frequency    x 2 wks  . Dysuria    x 2 wks     Patient ID: Joshua Golden, male    DOB: [birth year], 70 y.o.   MRN: [id number]  HPI HPI Comments: Joshua Golden is a 70 y.o. male who presents [facility] the [facility] complaining of frequency and dysuria onset two weeks ago.  Patient states that he now has to urinate much more than he used to and got up 6 times last night.  It is hard for him to have a good stream and he has had dribbling when he urinates.  He states that his symptoms last night was the worst it has ever been.  He currently does not feel like he has to urinate. Patient has no other questions or concerns today.   Patient Active Problem List   Diagnosis Date Noted  . S/P rotator cuff repair 02/06/2012  . Complete rotator cuff tear or rupture of shoulder 02/05/2012   Past Medical History  Diagnosis Date  . Thrombocytopenia (HCC)     hx of in third grade no problems now   . Allergy    Past Surgical History  Procedure Laterality Date  . Hernia repair      left inguinal hernia repair  . Shoulder open rotator cuff repair  02/05/2012    Procedure: ROTATOR CUFF REPAIR SHOULDER OPEN;  Surgeon: Jacki Cones, MD;  Location: [facility];  Service: Orthopedics;  Laterality: Right;   No Known Allergies Prior to Admission medications   Medication Sig Start Date End Date Taking? Authorizing Provider  amoxicillin (AMOXIL) 875 MG tablet Take 1 tablet (875 mg total) by mouth 2 (two) times daily. Patient not taking: Reported on 05/20/2016 04/05/14   Tonye Pearson, MD  naproxen sodium (ANAPROX) 220 MG tablet Take 220 mg by mouth 2 (two) times daily as needed. Reported on 05/20/2016    Historical Provider, MD  triamcinolone  (NASACORT AQ) 55 MCG/ACT AERO nasal inhaler Place 2 sprays into the nose daily. Patient not taking: Reported on 05/20/2016 04/05/14   Tonye Pearson, MD   Social History   Social History  . Marital Status: Married    Spouse Name: N/A  . Number of Children: N/A  . Years of Education: N/A   Occupational History  . Not on file.   Social History Main Topics  . Smoking status: Former Smoker -- 6 years    Types: Cigarettes  . Smokeless tobacco: Never Used  . Alcohol Use: 0.0 oz/week    0 Standard drinks or equivalent per week     Comment: occasional beer/wine  . Drug Use: No  . Sexual Activity: Not on file   Other Topics Concern  . Not on file   Social History Narrative       Review of Systems  Constitutional: Negative for fever and chills.  Eyes: Negative for pain, redness and itching.  Gastrointestinal: Negative for nausea and vomiting.  Genitourinary: Positive for dysuria, frequency, difficulty urinating and penile pain.  Musculoskeletal: Negative for neck pain and neck stiffness.  Neurological: Negative for seizures, syncope and speech difficulty.       Objective:   Physical Exam  Filed Vitals:   05/20/16 0824  BP: 162/78  Pulse: 84  Temp: 97.5 F (36.4 C)  TempSrc: Oral  Resp: 16  Height: 5' 6.5" (1.689 m)  Weight: 176 lb (79.833 kg)  SpO2: 98%    CONSTITUTIONAL: Well developed/well nourished HEAD: Normocephalic/atraumatic EYES: EOMI/PERRL ENMT: Mucous membranes moist NECK: supple no meningeal signs SPINE/BACK:entire spine nontender CV: S1/S2 noted, no murmurs/rubs/gallops noted LUNGS: Lungs are clear to auscultation bilaterally, no apparent distress ABDOMEN: Bladder: palpable to just below the umbilicus, tympanitic to percussion NEURO: Pt is awake/alert/appropriate, moves all extremitiesx4.  No facial droop.   SKIN: warm, color normal, multiple lipomas on skin PSYCH: no abnormalities of mood noted, alert and oriented to  situation Prostate:markedly enlarged with the right lobe being firm but not tender.  Results for orders placed or performed in visit on 05/20/16  POCT urinalysis dipstick  Result Value Ref Range   Color, UA yellow yellow   Clarity, UA clear clear   Glucose, UA negative negative   Bilirubin, UA negative negative   Ketones, POC UA negative negative   Spec Grav, UA 1.025    Blood, UA moderate (A) negative   pH, UA 6.0    Protein Ur, POC =100 (A) negative   Urobilinogen, UA 0.2    Nitrite, UA Negative Negative   Leukocytes, UA large (3+) (A) Negative  POCT Microscopic Urinalysis ([facility])  Result Value Ref Range   WBC,UR,HPF,POC Too numerous to count  (A) None WBC/hpf   RBC,UR,HPF,POC Few (A) None RBC/hpf   Bacteria None None, Too numerous to count   Mucus Absent Absent   Epithelial Cells, UR Per Microscopy None None, Too numerous to count cells/hpf        Assessment & Plan:   I'm concerned patient has a significant post void residual. His prostate is also significantly abnormal with a firm area right side of the prostate. Urine culture was sent. PSA was done. Basic metabolic panel was done. 1 g of Rocephin was given. He is given prescriptions for Cipro and Flomax. He will go from here to the urology office for evaluation of suspected incomplete bladder emptying.I personally performed the services described in this documentation, which was scribed in my presence. The recorded information has been reviewed and is accurate.

## 2016-05-20 NOTE — Patient Instructions (Addendum)
Please go from here to [facility] to see Dr. Annabell HowellsWrenn    IF you received an x-ray today, you will receive an invoice from [facility]. Please contact [facility] at [phone] with questions or concerns regarding your invoice.   IF you received labwork today, you will receive an invoice from [facility]. Please contact [facility] at [phone] with questions or concerns regarding your invoice.   Our billing staff will not be able to assist you with questions regarding bills from these companies.  You will be contacted with the lab results as soon as they are available. The fastest way to get your results is to activate your My Chart account. Instructions are located on the last page of this paperwork. If you have not heard from us regarding the results in 2 weeks, please contact this office.

## 2016-05-21 ENCOUNTER — Telehealth: Payer: Self-pay

## 2016-05-21 ENCOUNTER — Telehealth: Payer: Self-pay | Admitting: Emergency Medicine

## 2016-05-21 ENCOUNTER — Other Ambulatory Visit: Payer: Self-pay | Admitting: Emergency Medicine

## 2016-05-21 DIAGNOSIS — R7989 Other specified abnormal findings of blood chemistry: Secondary | ICD-10-CM

## 2016-05-21 DIAGNOSIS — N390 Urinary tract infection, site not specified: Secondary | ICD-10-CM

## 2016-05-21 DIAGNOSIS — R319 Hematuria, unspecified: Secondary | ICD-10-CM

## 2016-05-21 LAB — URINE CULTURE: Colony Count: >=100000

## 2016-05-21 MED ORDER — AMOXICILLIN 875 MG PO TABS: 875.0000 mg | ORAL_TABLET | Freq: Two times a day (BID) | ORAL | Status: DC

## 2016-05-21 NOTE — Telephone Encounter (Signed)
Left message for pt to call for lab results Will fax copy of lab results to Dr. Annabell HowellsWrenn

## 2016-05-21 NOTE — Telephone Encounter (Signed)
-----   Message from Collene GobbleSteven A Daub, MD sent at 05/21/2016  7:38 AM EDT ----- Call patient. His PSA is elevated and his creatinine is elevated. His creatinine needs to be repeated in 2 weeks. I have forwarded a copy of his PSA to Dr. Annabell HowellsWrenn. Please be sure this is done. Fax a copy of these labs to Dr. Annabell HowellsWrenn

## 2016-05-21 NOTE — Telephone Encounter (Signed)
Dr. Cleta Albertsaub  Do you mind putting in the future blood work for this pt. He said he will come back in two weeks like you asked for repeat labs

## 2016-06-18 ENCOUNTER — Other Ambulatory Visit: Payer: Self-pay | Admitting: Urology

## 2016-06-18 DIAGNOSIS — R972 Elevated prostate specific antigen [PSA]: Secondary | ICD-10-CM

## 2016-07-25 ENCOUNTER — Encounter (HOSPITAL_COMMUNITY)
Admission: RE | Admit: 2016-07-25 | Discharge: 2016-07-25 | Disposition: A | Discharge status: Home / Self Care | Payer: Medicare Other | Source: Ambulatory Visit | Attending: Urology | Admitting: Urology

## 2016-07-25 ENCOUNTER — Encounter (HOSPITAL_COMMUNITY): Payer: Self-pay

## 2016-07-25 DIAGNOSIS — N401 Enlarged prostate with lower urinary tract symptoms: Secondary | ICD-10-CM | POA: Diagnosis not present

## 2016-07-25 DIAGNOSIS — Z01812 Encounter for preprocedural laboratory examination: Secondary | ICD-10-CM | POA: Diagnosis present

## 2016-07-25 DIAGNOSIS — R338 Other retention of urine: Secondary | ICD-10-CM | POA: Insufficient documentation

## 2016-07-25 HISTORY — DX: Urinary tract infection, site not specified: N39.0

## 2016-07-25 HISTORY — DX: Unspecified osteoarthritis, unspecified site: M19.90

## 2016-07-25 HISTORY — DX: Other retention of urine: R33.8

## 2016-07-25 HISTORY — DX: Benign prostatic hyperplasia with lower urinary tract symptoms: N40.1

## 2016-07-25 LAB — BASIC METABOLIC PANEL
Anion gap: 3 — ABNORMAL LOW (ref 5–15)
BUN: 24 mg/dL — ABNORMAL HIGH (ref 6–20)
CALCIUM: 9.2 mg/dL (ref 8.9–10.3)
CO2: 24 mmol/L (ref 22–32)
CREATININE: 1.26 mg/dL — AB (ref 0.61–1.24)
Chloride: 112 mmol/L — ABNORMAL HIGH (ref 101–111)
GFR, EST NON AFRICAN AMERICAN: 57 mL/min — AB (ref >60)
Glucose, Bld: 87 mg/dL (ref 65–99)
Potassium: 5.2 mmol/L — ABNORMAL HIGH (ref 3.5–5.1)
SODIUM: 139 mmol/L (ref 135–145)

## 2016-07-25 LAB — CBC
HCT: 47 % (ref 39.0–52.0)
Hemoglobin: 15.9 g/dL (ref 13.0–17.0)
MCH: 29.5 pg (ref 26.0–34.0)
MCHC: 33.8 g/dL (ref 30.0–36.0)
MCV: 87.2 fL (ref 78.0–100.0)
PLATELETS: 155 10*3/uL (ref 150–400)
RBC: 5.39 MIL/uL (ref 4.22–5.81)
RDW: 13 % (ref 11.5–15.5)
WBC: 5.9 10*3/uL (ref 4.0–10.5)

## 2016-07-25 NOTE — Patient Instructions (Addendum)
Joshua Golden  07/25/2016   Your procedure is scheduled on: 07-30-16  [facility]  [facility] 3rd floor to  Short Stay Center at 0530 AM.  Call this number if you have problems the morning of surgery [phone]  Fleet enema at home per instructions Dr. Belva CromeWrenn's office.  Remember: ONLY 1 PERSON MAY GO WITH YOU TO SHORT STAY TO GET  READY MORNING OF YOUR SURGERY.  Do not eat food or drink liquids :After Midnight.   Take these medicines the morning of surgery with A SIP OF WATER: None. DO NOT TAKE ANY DIABETIC MEDICATIONS DAY OF YOUR SURGERY                               You may not have any metal on your body including hair pins and              piercings  Do not wear jewelry, make-up, lotions, powders or perfumes, deodorant             Do not wear nail polish.  Do not shave  48 hours prior to surgery.              Men may shave face and neck.   Do not bring valuables to the hospital. [facility] IS NOT             RESPONSIBLE   FOR VALUABLES.  Contacts, dentures or bridgework may not be worn into surgery.  Leave suitcase in the car. After surgery it may be brought to your room.     Patients discharged the day of surgery will not be allowed to drive home.  Name and phone number of your driver:Joshua DandyMary -spouse [phone] cell  Special Instructions: N/A              Please read over the following fact sheets you were given: _____________________________________________________________________             [facility] - Preparing for Surgery Before surgery, you can play an important role.  Because skin is not sterile, your skin needs to be as free of germs as possible.  You can reduce the number of germs on your skin by washing with CHG (chlorahexidine gluconate) soap before surgery.  CHG is an antiseptic cleaner which kills germs and bonds with the skin to continue killing germs even after washing. Please DO NOT use if you  have an allergy to CHG or antibacterial soaps.  If your skin becomes reddened/irritated stop using the CHG and inform your nurse when you arrive at Short Stay. Do not shave (including legs and underarms) for at least 48 hours prior to the first CHG shower.  You may shave your face/neck. Please follow these instructions carefully:  1.  Shower with CHG Soap the night before surgery and the  morning of Surgery.  2.  If you choose to wash your hair, wash your hair first as usual with your  normal  shampoo.  3.  After you shampoo, rinse your hair and body thoroughly to remove the  shampoo.                           4.  Use CHG as you would any other liquid soap.  You can apply  chg directly  to the skin and wash                       Gently with a scrungie or clean washcloth.  5.  Apply the CHG Soap to your body ONLY FROM THE NECK DOWN.   Do not use on face/ open                           Wound or open sores. Avoid contact with eyes, ears mouth and genitals (private parts).                       Wash face,  Genitals (private parts) with your normal soap.             6.  Wash thoroughly, paying special attention to the area where your surgery  will be performed.  7.  Thoroughly rinse your body with warm water from the neck down.  8.  DO NOT shower/wash with your normal soap after using and rinsing off  the CHG Soap.                9.  Pat yourself dry with a clean towel.            10.  Wear clean pajamas.            11.  Place clean sheets on your bed the night of your first shower and do not  sleep with pets. Day of Surgery : Do not apply any lotions/deodorants the morning of surgery.  Please wear clean clothes to the hospital/surgery center.  FAILURE TO FOLLOW THESE INSTRUCTIONS MAY RESULT IN THE CANCELLATION OF YOUR SURGERY PATIENT SIGNATURE_________________________________  NURSE  SIGNATURE__________________________________  ________________________________________________________________________

## 2016-07-29 MED ORDER — DEXTROSE 5 % IV SOLN
5.0000 mg/kg | INTRAVENOUS | Status: AC
  Administered 2016-07-30: 399.2 mg via INTRAVENOUS
  Filled 2016-07-29: qty 10

## 2016-07-29 NOTE — H&P (Signed)
CC/HPI: Preoperative office visit to assess urinalysis prior to the patient undergoing TURP in a few weeks. He will also undergo biopsy. He continues with CIC to help keep his bladder is empty as possible. Denies recent fevers, hematuria/dysuria or worsening voiding symptoms. He recently moved and is c/o lower back soreness because of this.     ALLERGIES: No Known Drug Allergies    MEDICATIONS: None   GU PSH: Catheterize For Residual - 05/23/2016 Complex cystometrogram, w/ void pressure and urethral pressure profile studies, any technique - 06/12/2016 Complex Uroflow - 06/12/2016 Cystoscopy - 06/14/2016 Emg surf Electrd - 06/12/2016 Hernia Repair Inject For cystogram - 06/12/2016 Intrabd voidng Press - 06/12/2016    NON-GU PSH: Shoulder Surgery (Unspecified)    GU PMH: BPH w/LUTS, He has visual and urodynamic obstruction with a trabeculated bladder and a ball valving middle lobe. - 06/14/2016 Elevated PSA, His PSA was 4.06 and with the prostate asymmetry he will need a prostate biopsy. - 06/14/2016 Urinary Retention (Stable) - 05/23/2016, Urinary Retention - 05/20/2016 Acute prostatitis - 05/20/2016 Nodular prostate w/ LUTS - 05/20/2016 Oth general symptoms and signs - 05/20/2016      PMH Notes: bleeding disorder   NON-GU PMH: Essential (hemorrhagic) thrombocythemia    FAMILY HISTORY: 1 Daughter - Other 3 sons - Other Cancer - Sister Death of family member - Mother, Father Sjogren's disease - Mother Strokes - Father   SOCIAL HISTORY: Marital Status: Married Current Smoking Status: Patient does not smoke anymore.  Does not use smokeless tobacco. Drinks 1 drink per day.  Does not use drugs. Drinks 3 caffeinated drinks per day. Patient's occupation is/was retired.     Notes: 3 sons, 1 daughter he is moving and has to be out of his house by August 10.    REVIEW OF SYSTEMS:    GU Review Male:   Patient reports trouble starting your stream. Patient denies frequent urination, hard to  postpone urination, burning/ pain with urination, get up at night to urinate, leakage of urine, stream starts and stops, have to strain to urinate , erection problems, and penile pain.  Gastrointestinal (Upper):   Patient denies nausea, vomiting, and indigestion/ heartburn.  Gastrointestinal (Lower):   Patient denies diarrhea and constipation.  Constitutional:   Patient denies fever, night sweats, weight loss, and fatigue.  Skin:   Patient denies skin rash/ lesion and itching.  Eyes:   Patient denies blurred vision and double vision.  Ears/ Nose/ Throat:   Patient denies sore throat and sinus problems.  Hematologic/Lymphatic:   Patient denies swollen glands and easy bruising.  Cardiovascular:   Patient denies leg swelling and chest pains.  Respiratory:   Patient denies cough and shortness of breath.  Endocrine:   Patient denies excessive thirst.  Musculoskeletal:   Patient reports back pain and joint pain.   Neurological:   Patient denies headaches and dizziness.  Psychologic:   Patient denies anxiety and depression.   VITAL SIGNS:      07/22/2016 08:57 AM  BP 145/69 mmHg  Heart Rate 59 /min  Temperature 97.3 F / 36 C   GU PHYSICAL EXAMINATION:    Penis: Penile foley catheter present.    MULTI-SYSTEM PHYSICAL EXAMINATION:    Constitutional: Well-nourished. No physical deformities. Normally developed. Good grooming.   Neck: Neck symmetrical, not swollen. Normal tracheal position.   Respiratory: No labored breathing, no use of accessory muscles. CTA.  Cardiovascular: Normal temperature, normal extremity pulses, no swelling, no varicosities. S1/S2 wnl.   Skin: No  paleness, no jaundice, no cyanosis. No lesion, no ulcer, no rash.   Neurologic / Psychiatric: Oriented to time, oriented to place, oriented to person. No depression, no anxiety, no agitation.   Gastrointestinal: No mass, no tenderness, no rigidity, non obese abdomen.   Musculoskeletal: Spine, ribs, pelvis no bilateral  tenderness. Normal gait and station of head and neck.     PAST DATA REVIEWED:  Source Of History:  Patient  Records Review:   Previous Patient Records  Urine Test Review:   Urinalysis, Urine Culture and Sensitivity  Urodynamics Review:   Review Urodynamics Tests   05/20/16  PSA  Total PSA 4.06 ng/dl    78/29/5608/14/17  Urinalysis  Urine Appearance Cloudy   Urine Specimen Patient Cath   Urine Color Yellow   Urine Glucose Neg   Urine Bilirubin Neg   Urine Ketones Neg   Urine Specific Gravity 1.025   Urine Blood 3+   Urine pH 6.0   Urine Protein 2+   Urine Urobilinogen 0.2   Urine Nitrites Positive   Urine Leukocyte Esterase 1+   Urine WBC/hpf 20-40/hpf   Urine RBC/hpf 20-40/hpf   Urine Epithelial Cells 0-5/hpf   Urine Bacteria Many (>50/hpf)   Urine Mucous Not Present   Urine Yeast NS (Not Seen)   Urine Trichomonas Not Present   Urine Cystals NS (Not Seen)   Urine Casts NS (Not Seen)   Urine Sperm Not Present   Notes:                     UA with pyuria, rbc's and bacteria. Nitrate positive. Sent for c/s.   PROCEDURES:          Catheter / SP Tube - 51701 In and Out Catheterization  A 14 French red rubber or straight catheter was inserted into the bladder using sterile technique. A urinalysis was sent to the lab. A urine culture was sent to the lab.         Urinalysis w/Scope - 81001 Dipstick Dipstick Cont'd Micro  Specimen: Patient Cath Bilirubin: Neg WBC/hpf: 20-40/hpf  Color: Yellow Ketones: Neg RBC/hpf: 20-40/hpf  Appearance: Cloudy Blood: 3+ Bacteria: Many (>50/hpf)  Specific Gravity: 1.025 Protein: 2+ Cystals: NS (Not Seen)  pH: 6.0 Urobilinogen: 0.2 Casts: NS (Not Seen)  Glucose: Neg Nitrites: Positive Trichomonas: Not Present    Leukocyte Esterase: 1+ Mucous: Not Present      Epithelial Cells: 0-5/hpf      Yeast: NS (Not Seen)      Sperm: Not Present    ASSESSMENT:      ICD-10 Details  1 GU:   BPH w/LUTS - N40.1   2   Elevated PSA - R97.20   3   Urinary  Retention - R33.8    PLAN:           Orders Labs Urinalysis, Urine Culture and Sensitivity          Schedule Return Visit: Keep Scheduled Appointment - Office Visit  Procedure: [facility]22/2016 at [facility] [phone] - In and Out Catheterization (Catheterize For Residual) - 51701          Document Letter(s):  Created for Patient: Clinical Summary         Notes:   I will place patient on appropriate antimicrobial therapy based upon urine culture results. This will cover him until his surgery. Until then he'll continue performing CIC 3 times per day. Discussed his procedure with him and answering his many questions as  possible. I briefly whenever the recovery period. He will see me in postop. Patient in no acute distress today and I see no reason why he can't proceed with the procedure based on my exam today.

## 2016-07-30 ENCOUNTER — Ambulatory Visit (HOSPITAL_COMMUNITY)
Admission: RE | Admit: 2016-07-30 | Discharge: 2016-07-30 | Disposition: A | Discharge status: Home / Self Care | Payer: Medicare Other | Source: Ambulatory Visit | Attending: Urology | Admitting: Urology

## 2016-07-30 ENCOUNTER — Observation Stay (HOSPITAL_COMMUNITY)
Admission: RE | Admit: 2016-07-30 | Discharge: 2016-07-31 | Disposition: A | Discharge status: Home / Self Care | Payer: Medicare Other | Source: Ambulatory Visit | Attending: Urology | Admitting: Urology

## 2016-07-30 ENCOUNTER — Encounter (HOSPITAL_COMMUNITY)
Admission: RE | Disposition: A | Discharge status: Home / Self Care | Payer: Self-pay | Source: Ambulatory Visit | Attending: Urology

## 2016-07-30 ENCOUNTER — Ambulatory Visit (HOSPITAL_COMMUNITY): Payer: Medicare Other | Admitting: Anesthesiology

## 2016-07-30 ENCOUNTER — Encounter (HOSPITAL_COMMUNITY): Payer: Self-pay | Admitting: *Deleted

## 2016-07-30 DIAGNOSIS — N401 Enlarged prostate with lower urinary tract symptoms: Principal | ICD-10-CM | POA: Diagnosis present

## 2016-07-30 DIAGNOSIS — N411 Chronic prostatitis: Secondary | ICD-10-CM | POA: Diagnosis not present

## 2016-07-30 DIAGNOSIS — R338 Other retention of urine: Secondary | ICD-10-CM | POA: Insufficient documentation

## 2016-07-30 DIAGNOSIS — N138 Other obstructive and reflux uropathy: Secondary | ICD-10-CM | POA: Diagnosis not present

## 2016-07-30 DIAGNOSIS — Z87891 Personal history of nicotine dependence: Secondary | ICD-10-CM | POA: Insufficient documentation

## 2016-07-30 DIAGNOSIS — R972 Elevated prostate specific antigen [PSA]: Secondary | ICD-10-CM | POA: Diagnosis not present

## 2016-07-30 DIAGNOSIS — N210 Calculus in bladder: Secondary | ICD-10-CM | POA: Diagnosis not present

## 2016-07-30 HISTORY — PX: PROSTATE BIOPSY: SHX241

## 2016-07-30 HISTORY — PX: TRANSURETHRAL RESECTION OF PROSTATE: SHX73

## 2016-07-30 SURGERY — BIOPSY, PROSTATE, RECTAL APPROACH, WITH US GUIDANCE
Anesthesia: General

## 2016-07-30 MED ORDER — BISACODYL 10 MG RE SUPP: 10.0000 mg | Freq: Every day | RECTAL | Status: DC | PRN

## 2016-07-30 MED ORDER — DEXAMETHASONE SODIUM PHOSPHATE 4 MG/ML IJ SOLN
INTRAMUSCULAR | Status: DC | PRN
  Administered 2016-07-30: 10 mg via INTRAVENOUS

## 2016-07-30 MED ORDER — MIDAZOLAM HCL 5 MG/5ML IJ SOLN
INTRAMUSCULAR | Status: DC | PRN
  Administered 2016-07-30: 2 mg via INTRAVENOUS

## 2016-07-30 MED ORDER — PHENYLEPHRINE 40 MCG/ML (10ML) SYRINGE FOR IV PUSH (FOR BLOOD PRESSURE SUPPORT)
PREFILLED_SYRINGE | INTRAVENOUS | Status: AC
  Filled 2016-07-30: qty 10

## 2016-07-30 MED ORDER — MIDAZOLAM HCL 2 MG/2ML IJ SOLN
INTRAMUSCULAR | Status: AC
  Filled 2016-07-30: qty 2

## 2016-07-30 MED ORDER — MEPERIDINE HCL 50 MG/ML IJ SOLN: 6.2500 mg | INTRAMUSCULAR | Status: DC | PRN

## 2016-07-30 MED ORDER — SULFAMETHOXAZOLE-TRIMETHOPRIM 800-160 MG PO TABS: 1.0000 | ORAL_TABLET | Freq: Two times a day (BID) | ORAL | 0 refills | Status: DC

## 2016-07-30 MED ORDER — METOCLOPRAMIDE HCL 5 MG/ML IJ SOLN: 10.0000 mg | Freq: Once | INTRAMUSCULAR | Status: DC | PRN

## 2016-07-30 MED ORDER — LIDOCAINE HCL (CARDIAC) 20 MG/ML IV SOLN
INTRAVENOUS | Status: DC | PRN
  Administered 2016-07-30: 50 mg via INTRAVENOUS

## 2016-07-30 MED ORDER — ACETAMINOPHEN 325 MG PO TABS: 650.0000 mg | ORAL_TABLET | ORAL | Status: DC | PRN

## 2016-07-30 MED ORDER — LACTATED RINGERS IV SOLN: INTRAVENOUS | Status: DC

## 2016-07-30 MED ORDER — KCL IN DEXTROSE-NACL 20-5-0.45 MEQ/L-%-% IV SOLN
INTRAVENOUS | Status: DC
  Administered 2016-07-30 (×2): via INTRAVENOUS
  Filled 2016-07-30 (×3): qty 1000

## 2016-07-30 MED ORDER — FENTANYL CITRATE (PF) 100 MCG/2ML IJ SOLN
INTRAMUSCULAR | Status: DC | PRN
  Administered 2016-07-30 (×3): 25 ug via INTRAVENOUS

## 2016-07-30 MED ORDER — DEXTROSE 5 % IV SOLN
2.0000 g | INTRAVENOUS | Status: AC
  Administered 2016-07-30: 2 g via INTRAVENOUS

## 2016-07-30 MED ORDER — HYDROCODONE-ACETAMINOPHEN 5-325 MG PO TABS: 1.0000 | ORAL_TABLET | ORAL | Status: DC | PRN

## 2016-07-30 MED ORDER — FLEET ENEMA 7-19 GM/118ML RE ENEM: 1.0000 | ENEMA | Freq: Once | RECTAL | Status: DC | PRN

## 2016-07-30 MED ORDER — CEFTRIAXONE SODIUM 2 G IJ SOLR
INTRAMUSCULAR | Status: AC
  Filled 2016-07-30: qty 2

## 2016-07-30 MED ORDER — EPHEDRINE SULFATE 50 MG/ML IJ SOLN
INTRAMUSCULAR | Status: AC
  Filled 2016-07-30: qty 1

## 2016-07-30 MED ORDER — PROPOFOL 10 MG/ML IV BOLUS
INTRAVENOUS | Status: DC | PRN
  Administered 2016-07-30: 200 mg via INTRAVENOUS

## 2016-07-30 MED ORDER — DIPHENHYDRAMINE HCL 12.5 MG/5ML PO ELIX: 12.5000 mg | ORAL_SOLUTION | Freq: Four times a day (QID) | ORAL | Status: DC | PRN

## 2016-07-30 MED ORDER — HYDROCODONE-ACETAMINOPHEN 5-325 MG PO TABS: 1.0000 | ORAL_TABLET | Freq: Four times a day (QID) | ORAL | 0 refills | Status: DC | PRN

## 2016-07-30 MED ORDER — PHENYLEPHRINE HCL 10 MG/ML IJ SOLN
INTRAMUSCULAR | Status: DC | PRN
  Administered 2016-07-30: 40 ug via INTRAVENOUS
  Administered 2016-07-30 (×2): 80 ug via INTRAVENOUS

## 2016-07-30 MED ORDER — DOCUSATE SODIUM 100 MG PO CAPS
100.0000 mg | ORAL_CAPSULE | Freq: Two times a day (BID) | ORAL | Status: DC
  Administered 2016-07-30 – 2016-07-31 (×3): 100 mg via ORAL
  Filled 2016-07-30 (×3): qty 1

## 2016-07-30 MED ORDER — LACTATED RINGERS IV SOLN
INTRAVENOUS | Status: DC | PRN
  Administered 2016-07-30 (×2): via INTRAVENOUS

## 2016-07-30 MED ORDER — ONDANSETRON HCL 4 MG/2ML IJ SOLN: 4.0000 mg | INTRAMUSCULAR | Status: DC | PRN

## 2016-07-30 MED ORDER — DIPHENHYDRAMINE HCL 50 MG/ML IJ SOLN: 12.5000 mg | Freq: Four times a day (QID) | INTRAMUSCULAR | Status: DC | PRN

## 2016-07-30 MED ORDER — SODIUM CHLORIDE 0.9 % IJ SOLN
INTRAMUSCULAR | Status: AC
  Filled 2016-07-30: qty 10

## 2016-07-30 MED ORDER — SENNOSIDES-DOCUSATE SODIUM 8.6-50 MG PO TABS: 1.0000 | ORAL_TABLET | Freq: Every evening | ORAL | Status: DC | PRN

## 2016-07-30 MED ORDER — FENTANYL CITRATE (PF) 100 MCG/2ML IJ SOLN: 25.0000 ug | INTRAMUSCULAR | Status: DC | PRN

## 2016-07-30 MED ORDER — HYDROMORPHONE HCL 1 MG/ML IJ SOLN: 0.5000 mg | INTRAMUSCULAR | Status: DC | PRN

## 2016-07-30 MED ORDER — SULFAMETHOXAZOLE-TRIMETHOPRIM 800-160 MG PO TABS
1.0000 | ORAL_TABLET | Freq: Two times a day (BID) | ORAL | Status: DC
  Administered 2016-07-30 – 2016-07-31 (×3): 1 via ORAL
  Filled 2016-07-30 (×3): qty 1

## 2016-07-30 MED ORDER — FENTANYL CITRATE (PF) 100 MCG/2ML IJ SOLN
INTRAMUSCULAR | Status: AC
  Filled 2016-07-30: qty 2

## 2016-07-30 MED ORDER — METOCLOPRAMIDE HCL 5 MG/ML IJ SOLN
INTRAMUSCULAR | Status: AC
  Filled 2016-07-30: qty 2

## 2016-07-30 MED ORDER — ZOLPIDEM TARTRATE 5 MG PO TABS: 5.0000 mg | ORAL_TABLET | Freq: Every evening | ORAL | Status: DC | PRN

## 2016-07-30 MED ORDER — ONDANSETRON HCL 4 MG/2ML IJ SOLN
INTRAMUSCULAR | Status: AC
  Filled 2016-07-30: qty 2

## 2016-07-30 MED ORDER — METOCLOPRAMIDE HCL 5 MG/ML IJ SOLN
INTRAMUSCULAR | Status: DC | PRN
  Administered 2016-07-30: 10 mg via INTRAVENOUS

## 2016-07-30 MED ORDER — LIDOCAINE HCL (CARDIAC) 20 MG/ML IV SOLN
INTRAVENOUS | Status: AC
  Filled 2016-07-30: qty 5

## 2016-07-30 MED ORDER — HYOSCYAMINE SULFATE 0.125 MG SL SUBL
0.1250 mg | SUBLINGUAL_TABLET | SUBLINGUAL | Status: DC | PRN
  Filled 2016-07-30: qty 1

## 2016-07-30 MED ORDER — DEXAMETHASONE SODIUM PHOSPHATE 10 MG/ML IJ SOLN
INTRAMUSCULAR | Status: AC
  Filled 2016-07-30: qty 1

## 2016-07-30 MED ORDER — PROPOFOL 10 MG/ML IV BOLUS
INTRAVENOUS | Status: AC
  Filled 2016-07-30: qty 20

## 2016-07-30 MED ORDER — SODIUM CHLORIDE 0.9 % IR SOLN
3000.0000 mL | Status: DC
  Administered 2016-07-30: 3000 mL

## 2016-07-30 MED ORDER — SODIUM CHLORIDE 0.9 % IR SOLN
Status: DC | PRN
  Administered 2016-07-30: 18000 mL

## 2016-07-30 SURGICAL SUPPLY — 17 items
BAG URINE DRAINAGE (UROLOGICAL SUPPLIES) ×3 IMPLANT
BAG URO CATCHER STRL LF (MISCELLANEOUS) ×3 IMPLANT
CATH FOLEY 3WAY 30CC 22FR (CATHETERS) ×3 IMPLANT
ELECT REM PT RETURN 9FT ADLT (ELECTROSURGICAL) ×3
ELECTRODE REM PT RTRN 9FT ADLT (ELECTROSURGICAL) ×1 IMPLANT
GLOVE SURG SS PI 8.0 STRL IVOR (GLOVE) IMPLANT
GOWN STRL REUS W/TWL XL LVL3 (GOWN DISPOSABLE) ×3 IMPLANT
HOLDER FOLEY CATH W/STRAP (MISCELLANEOUS) ×3 IMPLANT
INST BIOPSY MAXCORE 18GX25 (NEEDLE) ×3 IMPLANT
LOOP CUT BIPOLAR 24F LRG (ELECTROSURGICAL) ×3 IMPLANT
MANIFOLD NEPTUNE II (INSTRUMENTS) ×3 IMPLANT
PACK CYSTO (CUSTOM PROCEDURE TRAY) ×3 IMPLANT
SET ASPIRATION TUBING (TUBING) IMPLANT
SYR 30ML LL (SYRINGE) IMPLANT
SYRINGE IRR TOOMEY STRL 70CC (SYRINGE) ×3 IMPLANT
TUBING CONNECTING 10 (TUBING) ×2 IMPLANT
TUBING CONNECTING 10' (TUBING) ×1

## 2016-07-30 NOTE — Op Note (Deleted)
  The note originally documented on this encounter has been moved the the encounter in which it belongs.  

## 2016-07-30 NOTE — Plan of Care (Signed)
Problem: Safety: Goal: Ability to remain free from injury will improve Outcome: Completed/Met Date Met: 07/30/16 Gait is steady. Pt encouraged to call for assistance with activity. Pt is agreeable to safety prevention plan

## 2016-07-30 NOTE — Brief Op Note (Signed)
07/30/2016  8:55 AM  PATIENT:  Joshua Golden  70 y.o. male  PRE-OPERATIVE DIAGNOSIS:  BENIGN PROSTATIC HYPERPLASIA WITH RETENTION, ELEVATED PROSTATIC SPECIFIC ANTIGEN  POST-OPERATIVE DIAGNOSIS:  BENIGN PROSTATIC HYPERPLASIA WITH RETENTION, ELEVATED PROSTATIC SPECIFIC ANTIGEN, DLADDER STONE  PROCEDURE:  Procedure(s): BIOPSY TRANSRECTAL ULTRASONIC PROSTATE (TUBP) (N/A) TRANSURETHRAL RESECTION OF THE PROSTATE (TURP) (N/A)  REMOVAL OF BLADDER STONE  SURGEON:  Surgeon(s) and Role:    * Joshua PippinJohn Helder Crisafulli, MD - Primary  PHYSICIAN ASSISTANT:   ASSISTANTS: none   ANESTHESIA:   general  EBL:  Total I/O In: 159.8 [IV Piggyback:159.8] Out: 5 [Blood:5]  BLOOD ADMINISTERED:none  DRAINS: 4022fr 3 way foley   LOCAL MEDICATIONS USED:  NONE  SPECIMEN:  Source of Specimen:  12 prostate biopsy cores and TUR chips.   DISPOSITION OF SPECIMEN:  PATHOLOGY  COUNTS:  YES  TOURNIQUET:  * No tourniquets in log *  DICTATION: .Other Dictation: Dictation Number [id number]  PLAN OF CARE: Admit for overnight observation  PATIENT DISPOSITION:  PACU - hemodynamically stable.   Delay start of Pharmacological VTE agent (>24hrs) due to surgical blood loss or risk of bleeding: yes

## 2016-07-30 NOTE — Op Note (Signed)
NAMWaldon Golden:  Joshua Golden, Joshua Golden                ACCOUNT NO.:  [id number]  MEDICAL RECORD NO.:  [id number]  LOCATION:                                 FACILITY:  PHYSICIAN:  Excell SeltzerJohn J. Annabell HowellsWrenn, M.D.    DATE OF BIRTH:  February 23, 1946  DATE OF PROCEDURE:  07/30/2016 DATE OF DISCHARGE:                              OPERATIVE REPORT   PROCEDURES: 1. Transrectal ultrasound of the prostate with ultrasound-guided     prostate biopsy. 2. Cystoscopy with removal of bladder stone (simple). 3. Transurethral resection of prostate.  PREOPERATIVE DIAGNOSES:  Elevated PSA and benign prostatic hypertrophy with retention.  POSTOPERATIVE DIAGNOSES:  Elevated PSA and benign prostatic hypertrophy with retention.  SURGEON:  Excell SeltzerJohn J. Annabell HowellsWrenn, M.D.  ANESTHESIA:  General.  SPECIMENS: 1. Stone that was given to the patient's family. 2. A 12-core prostate biopsy and TUR chips sent to Pathology.  DRAINS:  A 22-French 3-way Foley catheter.  BLOOD LOSS:  Minimal.  COMPLICATIONS:  None.  INDICATIONS:  Mr. Joshua Golden is a 70 year old male with BPH and bladder outlet obstruction and retention.  He has also had an elevated PSA.  He is to undergo transrectal ultrasound-guided prostate biopsy and transurethral section of the prostate.  FINDINGS AND PROCEDURE:  He had been on antibiotics preoperatively.  He was given an additional dose of Rocephin and gentamicin perioperatively. He was taken to the operating room where general anesthetic was induced. He was placed in lithotomy position and was fitted with PAS hose.  His perineum and genitalia were prepped with Betadine solution and he was draped in usual sterile fashion.  Prior to prepping the patient, the 10 megahertz transrectal ultrasound probe was inserted and prostate scan was performed, this revealed a 52 mL prostate with significant intravesical extension, there was collection of stones in the left mid apical transitional zone and large BPH nodule on the right.  No other  hilar hypoechoic or worrisome lesions were noted.  The seminal vesicles were unremarkable.  After completion of the diagnostic scan, a 12-core biopsy was obtained in the standard configuration.  There was some minor urethral bleeding, but no other bleeding was noted.  Once the cores were taken, the patient was then prepped and draped as noted earlier.  Cystoscopy was performed using the 22-French scope and 30-degree lens. Examination revealed the normal urethra.  The external sphincter was intact.  The prostatic urethra had trilobar hyperplasia with obstruction.  Examination of the bladder revealed moderate trabeculation.  There was approximately 2-cm diverticulum on the right posterior wall.  The ureteral orifices were unremarkable well away from the bladder neck.  There was a small bladder stone noted at the base the bladder, no tumors were noted.  The stone was evacuated through the cystoscope.  The urethra was then calibrated to 30-French with Sissy Hoffvan Buren sounds and a 28-French continuous flow resectoscope sheath was placed with the aid of the visual operator.  The visual obturator was removed and was replaced with an Veronda PrudeIglesias Handle with a bipolar loop and 30-degree lens.  Saline was used as the irrigant.  Resection of the prostate was initiated at the middle lobe, which was resected down to the bladder neck fibers.  The floor of the prostate was resected out to alongside of the verumontanum.  The left lateral lobe was then resected from bladder neck to apex followed by the right lateral lobe.  At this point, chips were evacuated and reinspection revealed some residual right apical tissue, which was resected and anterior tissue, which was resected.  Once this additional resection was performed, the additional chips were removed along with some stones that had been unroofed in the prostate. Final hemostasis was achieved, final inspection revealed no retained chips, no  stones in the bladder.  The ureteral orifices were intact as was the external sphincter.  The scope was removed, pressure on the bladder produced an excellent stream.  A 22-French 3-way Foley catheter was inserted with the aid of catheter guide.  The balloon was inflated to 30 mL and the catheter was irrigated with clear return and placed to continuous irrigation and straight drainage.     Excell SeltzerJohn J. Annabell HowellsWrenn, M.D.   ______________________________ Excell SeltzerJohn J. Annabell HowellsWrenn, M.D.    JJW/MEDQ  D:  07/30/2016  T:  07/30/2016  Job:  [id number]

## 2016-07-30 NOTE — Plan of Care (Signed)
Problem: Education: Goal: Knowledge of [facility] Education information/materials will improve Outcome: Completed/Met Date Met: 07/30/16 Education provided regarding current  Treatment plan. Pt/spouse agreeable, verbalized understanding. questions concerns denied

## 2016-07-30 NOTE — Transfer of Care (Signed)
Immediate Anesthesia Transfer of Care Note  Patient: Joshua Golden  Procedure(s) Performed: Procedure(s): BIOPSY TRANSRECTAL ULTRASONIC PROSTATE (TUBP) (N/A) TRANSURETHRAL RESECTION OF THE PROSTATE (TURP) (N/A)  Patient Location: PACU  Anesthesia Type:General  Level of Consciousness: Patient easily awoken, sedated, comfortable, cooperative, following commands, responds to stimulation.   Airway & Oxygen Therapy: Patient spontaneously breathing, ventilating well, oxygen via simple oxygen mask.  Post-op Assessment: Report given to PACU RN, vital signs reviewed and stable, moving all extremities.   Post vital signs: Reviewed and stable.  Complications: No apparent anesthesia complications Last Vitals:  Vitals:   07/30/16 0536  BP: 127/63  Pulse: 68  Resp: 18  Temp: 36.5 C    Last Pain:  Vitals:   07/30/16 0536  TempSrc: Oral      Patients Stated Pain Goal: 4 (07/30/16 [id number])  Complications: No apparent anesthesia complications

## 2016-07-30 NOTE — Anesthesia Postprocedure Evaluation (Signed)
Anesthesia Post Note  Patient: Joshua Golden  Procedure(s) Performed: Procedure(s) (LRB): BIOPSY TRANSRECTAL ULTRASONIC PROSTATE (TUBP) (N/A) TRANSURETHRAL RESECTION OF THE PROSTATE (TURP) (N/A)  Patient location during evaluation: PACU Anesthesia Type: General Level of consciousness: awake and alert Pain management: pain level controlled Vital Signs Assessment: post-procedure vital signs reviewed and stable Respiratory status: spontaneous breathing, nonlabored ventilation, respiratory function stable and patient connected to nasal cannula oxygen Cardiovascular status: blood pressure returned to baseline and stable Postop Assessment: no signs of nausea or vomiting Anesthetic complications: no    Last Vitals:  Vitals:   07/30/16 1000 07/30/16 1015  BP: 128/79 136/70  Pulse: 63 67  Resp: 12 12  Temp: 36.5 C 36.6 C    Last Pain:  Vitals:   07/30/16 1000  TempSrc:   PainSc: 0-No pain                 Phillips Groutarignan, Dang Mathison

## 2016-07-30 NOTE — Interval H&P Note (Signed)
History and Physical Interval Note:  07/30/2016 7:21 AM  Joshua Golden  has presented today for surgery, with the diagnosis of BENIGN PROSTATIC HYPERPLASIA WITH RETENTION, ELEVATED PROSTATIC SPECIFIC ANTIGEN  The various methods of treatment have been discussed with the patient and family. After consideration of risks, benefits and other options for treatment, the patient has consented to  Procedure(s): BIOPSY TRANSRECTAL ULTRASONIC PROSTATE (TUBP) (N/A) TRANSURETHRAL RESECTION OF THE PROSTATE (TURP) (N/A) as a surgical intervention .  The patient's history has been reviewed, patient examined, no change in status, stable for surgery.  I have reviewed the patient's chart and labs.  Questions were answered to the patient's satisfaction.     Joshua Golden

## 2016-07-30 NOTE — Discharge Instructions (Addendum)
Transurethral Resection of the Prostate, Care After Refer to this sheet in the next few weeks. These instructions provide you with information on caring for yourself after your procedure. Your caregiver also may give you specific instructions. Your treatment has been planned according to current medical practices, but complications sometimes occur. Call your caregiver if you have any problems or questions after your procedure. HOME CARE INSTRUCTIONS  Recovery can take 4-6 weeks. Avoid alcohol, caffeinated drinks, and spicy foods for 2 weeks after your procedure. Drink enough fluids to keep your urine clear or pale yellow. Urinate as soon as you feel the urge to do so. Do not try to hold your urine for long periods of time. During recovery you may experience pain caused by bladder spasms, which result in a very intense urge to urinate. Take all medicines as directed by your caregiver, including medicines for pain. Try to limit the amount of pain medicines you take because it can cause constipation. If you do become constipated, do not strain to move your bowels. Straining can increase bleeding. Constipation can be minimized by increasing the amount fluids and fiber in your diet. Your caregiver also may prescribe a stool softener. Do not lift heavy objects (more than 5 lb [2.25 kg]) or perform exercises that cause you to strain for at least 1 month after your procedure. When sitting, you may want to sit in a soft chair or use a cushion. For the first 10 days after your procedure, avoid the following activities:  Running.  Strenuous work.  Long walks.  Riding in a car for extended periods.  Sex. SEEK MEDICAL CARE IF:  You have difficulty urinating.  You have blood in your urine that does not go away after you rest or increase your fluid intake.  You have swelling in your penis or scrotum. SEEK IMMEDIATE MEDICAL CARE IF:   You are suddenly unable to urinate.  You notice blood clots in your  urine.  You have chills.  You have a fever.  You have pain in your back or lower abdomen.  You have pain or swelling in your legs. MAKE SURE YOU:   Understand these instructions.  Will watch your condition.  Will get help right away if you are not doing well or get worse.   This information is not intended to replace advice given to you by your health care provider. Make sure you discuss any questions you have with your health care provider.   Document Released: 11/25/2005 Document Revised: 12/16/2014 Document Reviewed: 01/03/2012 Elsevier Interactive Patient Education [employer]2016 [employer]    Patient being discharged to home with foley catheter.  May discontinue catheter on Friday per nurses instructions.  If unable to void after discontinuing on Friday, may in and out catheter as previously performed. Tommy Medalark, Jamea Robicheaux B/ verbal order Dr. Annabell HowellsWrenn

## 2016-07-30 NOTE — Anesthesia Procedure Notes (Signed)
Procedure Name: LMA Insertion Date/Time: 07/30/2016 7:40 AM Performed by: Ludwig LeanJONES, Jyden Kromer C Pre-anesthesia Checklist: Patient identified, Emergency Drugs available, Suction available and Patient being monitored Patient Re-evaluated:Patient Re-evaluated prior to inductionOxygen Delivery Method: Circle system utilized Preoxygenation: Pre-oxygenation with 100% oxygen Intubation Type: IV induction Ventilation: Mask ventilation without difficulty LMA: LMA inserted LMA Size: 4.0 Number of attempts: 1 Placement Confirmation: positive ETCO2 and breath sounds checked- equal and bilateral Tube secured with: Tape Dental Injury: Teeth and Oropharynx as per pre-operative assessment

## 2016-07-30 NOTE — Anesthesia Preprocedure Evaluation (Addendum)
Anesthesia Evaluation  Patient identified by MRN, date of birth, ID band Patient awake    Reviewed: Allergy & Precautions, H&P , NPO status , Patient's Chart, lab work & pertinent test results  Airway Mallampati: II  TM Distance: <3 FB Neck ROM: Full    Dental no notable dental hx.    Pulmonary neg pulmonary ROS, former smoker,    Pulmonary exam normal breath sounds clear to auscultation       Cardiovascular negative cardio ROS Normal cardiovascular exam Rhythm:Regular Rate:Normal     Neuro/Psych negative neurological ROS  negative psych ROS   GI/Hepatic negative GI ROS, Neg liver ROS,   Endo/Other  negative endocrine ROS  Renal/GU negative Renal ROS  negative genitourinary   Musculoskeletal negative musculoskeletal ROS (+)   Abdominal   Peds negative pediatric ROS (+)  Hematology negative hematology ROS (+)   Anesthesia Other Findings   Reproductive/Obstetrics negative OB ROS                             Anesthesia Physical  Anesthesia Plan  ASA: I  Anesthesia Plan: General   Post-op Pain Management:    Induction: Intravenous  Airway Management Planned: LMA  Additional Equipment:   Intra-op Plan:   Post-operative Plan:   Informed Consent: I have reviewed the patients History and Physical, chart, labs and discussed the procedure including the risks, benefits and alternatives for the proposed anesthesia with the patient or authorized representative who has indicated his/her understanding and acceptance.   Dental advisory given  Plan Discussed with:   Anesthesia Plan Comments:         Anesthesia Quick Evaluation

## 2016-07-31 ENCOUNTER — Encounter (HOSPITAL_COMMUNITY): Payer: Self-pay | Admitting: Urology

## 2016-07-31 DIAGNOSIS — R972 Elevated prostate specific antigen [PSA]: Secondary | ICD-10-CM | POA: Diagnosis present

## 2016-07-31 DIAGNOSIS — N401 Enlarged prostate with lower urinary tract symptoms: Secondary | ICD-10-CM | POA: Diagnosis not present

## 2016-07-31 NOTE — Discharge Summary (Signed)
Physician Discharge Summary  Patient ID: Joshua Golden MRN: [id number] DOB/AGE: 1946/01/26 70 y.o.  Admit date: 07/30/2016 Discharge date: 07/31/2016  Admission Diagnoses:  BPH (benign prostatic hypertrophy) with urinary retention  Discharge Diagnoses:  Principal Problem:   BPH (benign prostatic hypertrophy) with urinary retention Active Problems:   Elevated PSA, less than 10 ng/ml   Past Medical History:  Diagnosis Date  . Allergy   . Arthritis    arthritis- fingers  . Hypertrophy of prostate with urinary retention    in and out self catheterization x3 daily at present  . 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 UTI (urinary tract infection)    07-25-16 being tx now    Surgeries: Procedure(s): BIOPSY TRANSRECTAL ULTRASONIC PROSTATE (TUBP) TRANSURETHRAL RESECTION OF THE PROSTATE (TURP) on 07/30/2016   Consultants (if any):   Discharged Condition: Improved  Hospital Course: Joshua IllLloyd W Mccaul is an 70 y.o. male who was admitted 07/30/2016 with a diagnosis of BPH (benign prostatic hypertrophy) with urinary retention and went to the operating room on 07/30/2016 and underwent the above named procedures.  He did well without complications and was discharged hom on 8/23 after foley removal and confirmation of successful voiding.   He was given perioperative antibiotics:  Anti-infectives    Start     Dose/Rate Route Frequency Ordered Stop   07/30/16 1130  sulfamethoxazole-trimethoprim (BACTRIM DS,SEPTRA DS) 800-160 MG per tablet 1 tablet     1 tablet Oral Every 12 hours 07/30/16 1035     07/30/16 0551  cefTRIAXone (ROCEPHIN) 2 g in dextrose 5 % 50 mL IVPB     2 g 100 mL/hr over 30 Minutes Intravenous 30 min pre-op 07/30/16 0551 07/30/16 0742   07/30/16 0000  sulfamethoxazole-trimethoprim (BACTRIM DS,SEPTRA DS) 800-160 MG tablet     1 tablet Oral 2 times daily 07/30/16 0910     07/29/16 1317  gentamicin (GARAMYCIN) 400 mg in dextrose 5 % 100 mL IVPB     5  mg/kg  79.8 kg 110 mL/hr over 60 Minutes Intravenous 30 min pre-op 07/29/16 1317 07/30/16 0816    .  He was given sequential compression devices and early ambulation for DVT prophylaxis.  He benefited maximally from the hospital stay and there were no complications.    Recent vital signs:  Vitals:   07/31/16 0344 07/31/16 0624  BP: 129/63 132/83  Pulse: 63 65  Resp: 14 14  Temp: 98.1 F (36.7 C) 98.1 F (36.7 C)    Recent laboratory studies:  Lab Results  Component Value Date   HGB 15.9 07/25/2016   HGB 16.2 05/20/2016   HGB 16.8 02/03/2012   Lab Results  Component Value Date   WBC 5.9 07/25/2016   PLT 155 07/25/2016   Lab Results  Component Value Date   INR 0.91 02/03/2012   Lab Results  Component Value Date   NA 139 07/25/2016   K 5.2 (H) 07/25/2016   CL 112 (H) 07/25/2016   CO2 24 07/25/2016   BUN 24 (H) 07/25/2016   CREATININE 1.26 (H) 07/25/2016   GLUCOSE 87 07/25/2016    Discharge Medications:     Medication List    TAKE these medications   HYDROcodone-acetaminophen 5-325 MG tablet Commonly known as:  NORCO Take 1 tablet by mouth every 6 (six) hours as needed for moderate pain.   sulfamethoxazole-trimethoprim 800-160 MG tablet Commonly known as:  BACTRIM DS,SEPTRA DS Take 1 tablet by mouth 2 (two)  times daily.       Diagnostic Studies: [place] Intraoperative  Result Date: 07/30/2016 CLINICAL DATA: Elevated prostate specific antigen (PSA) Ultrasound was provided for use by the ordering physician, and a technical charge was applied by the performing facility.  No radiologist interpretation/professional services rendered.    Disposition: 01-Home or Self Care    Follow-up Information    Hillery AldoLarry Ryan Gibson, NP Follow up on 08/13/2016.   Specialty:  Nurse Practitioner Why:  9am Contact information: [address] [place] [address] [phone]            Signed: Anner CreteWRENN,Monquie Fulgham J 07/31/2016, 6:47 AM

## 2016-07-31 NOTE — Progress Notes (Signed)
Pt voided 125cc bloody urine.  PVR 906.  Pt requested RN to give a little more time.  Foley was dc'd at 0645, informed pt we could wait until 12n.  Pt voided an additional 150cc at 1207.  RN will notify MD. Nino Parsleyark, Shanna Strength B

## 2016-08-29 IMAGING — US US BIOPSY PROSTATE MULTIPLE
1 series · 14 of 25 positions shown · non-contrast
Comparison: None.

CLINICAL DATA: Elevated PSA

EXAM:
US?BIOPSY?PROSTATE?MULTIPLE; ULTRAOUND INTRAOPERATIVE - NRPT [facility];
IR ULTRASOUND GUIDED ASPIRATION/DRAINAGE; TRANSRECTAL ULTRASOUND

[Series 1: us biopsy prostate multiple · 0.11mm/px · 14 of 44 slices shown]
[im 1/44]
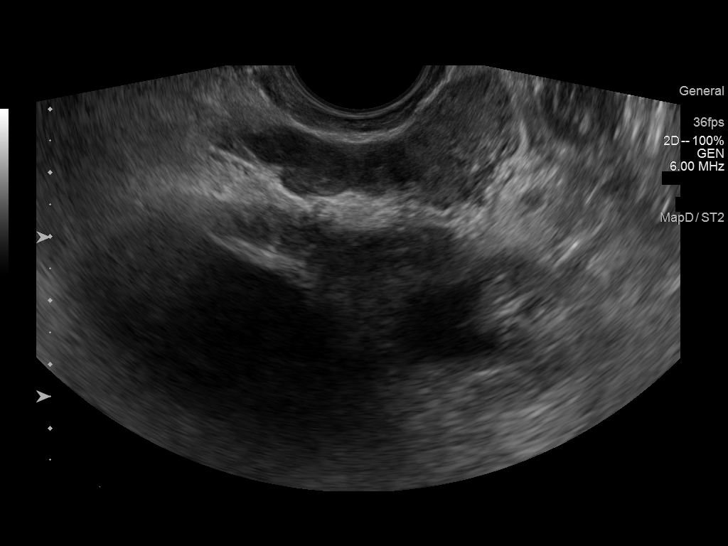
[im 4/44]
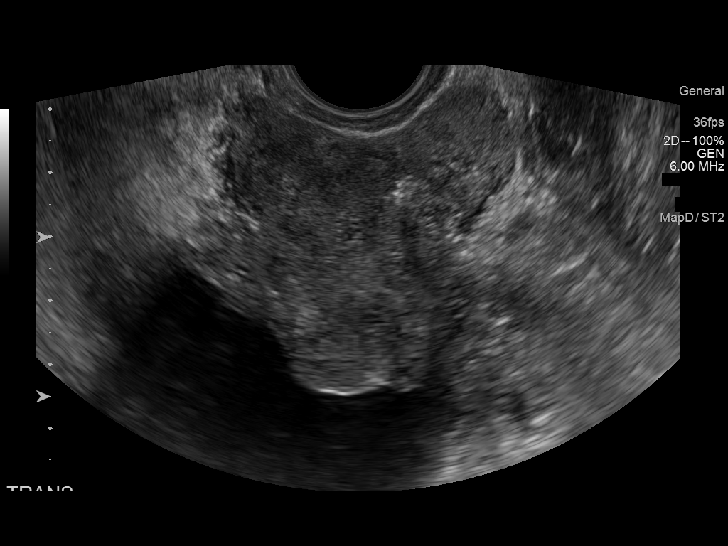
[im 8/44]
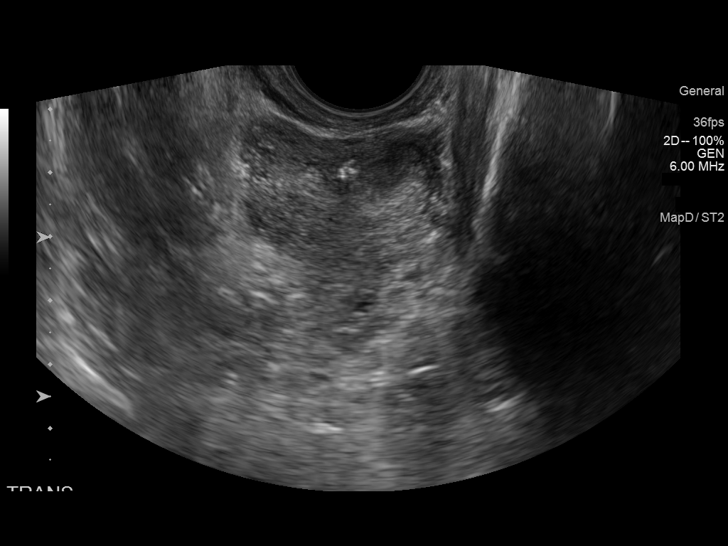
[im 11/44]
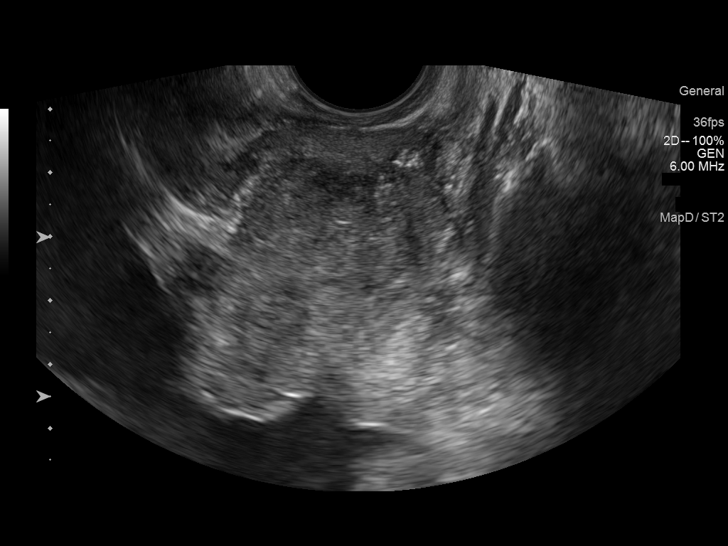
[im 15/44]
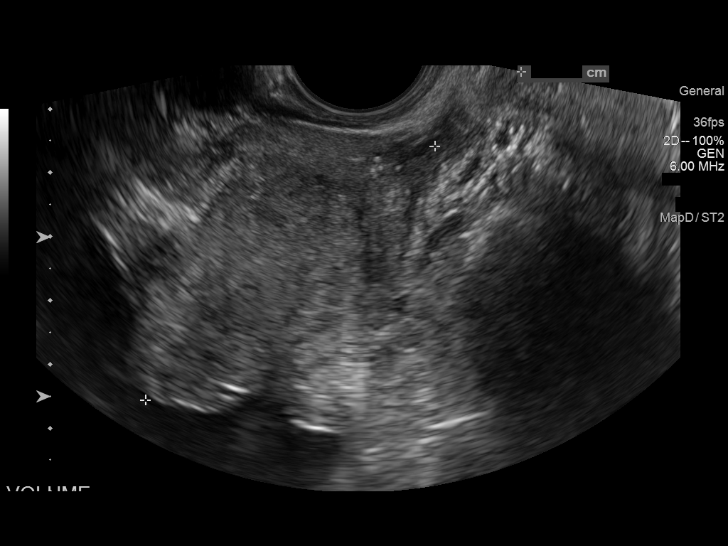
[im 17/44]
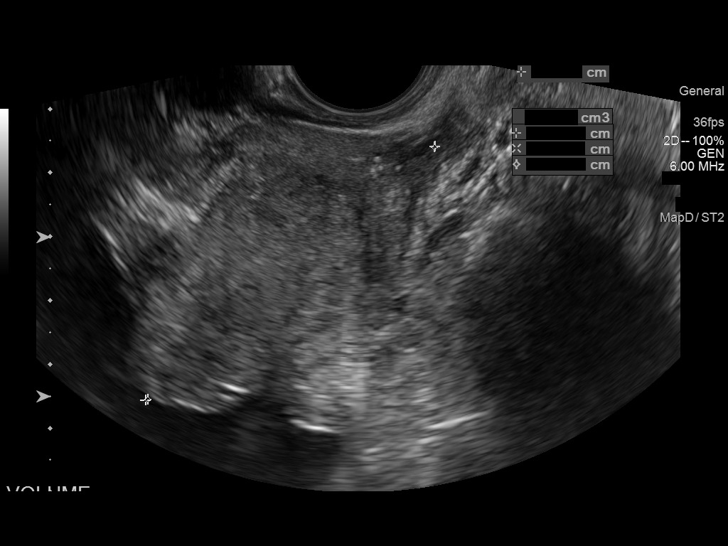
[im 20/44]
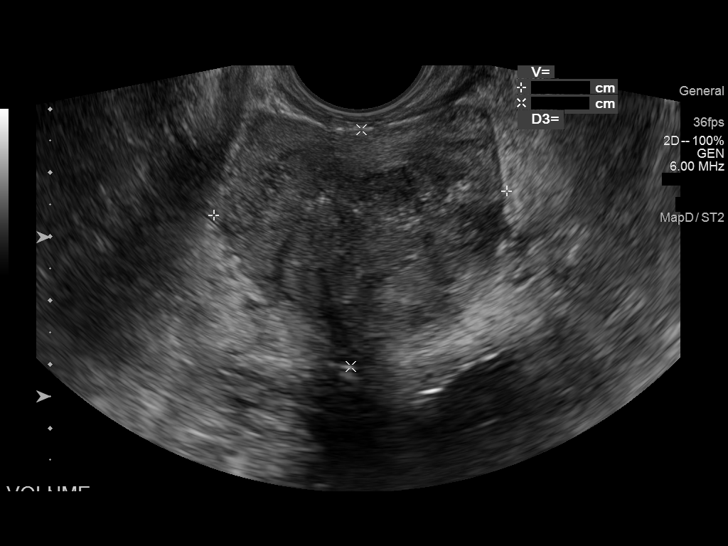
[im 24/44]
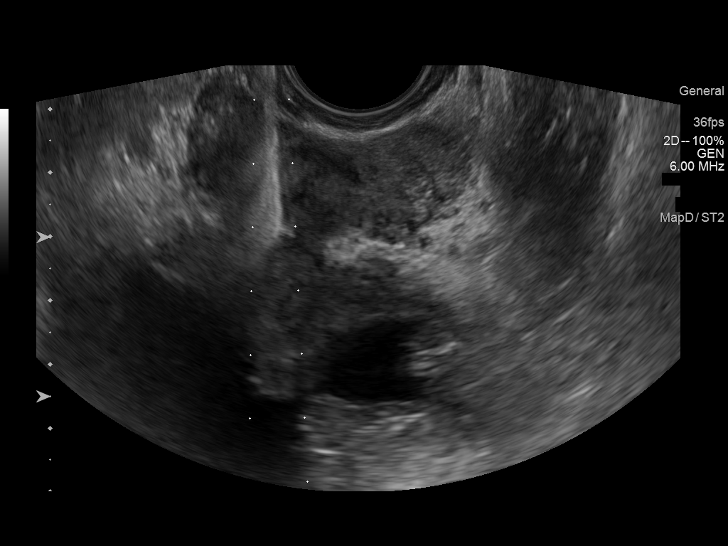
[im 27/44]
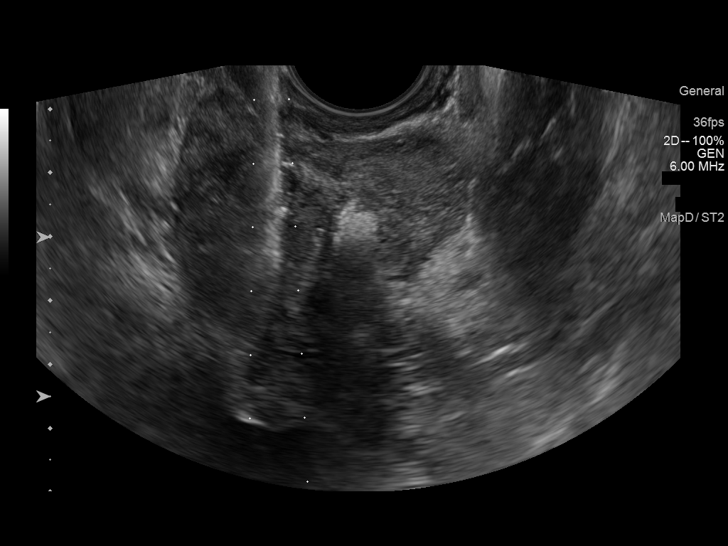
[im 29/44]
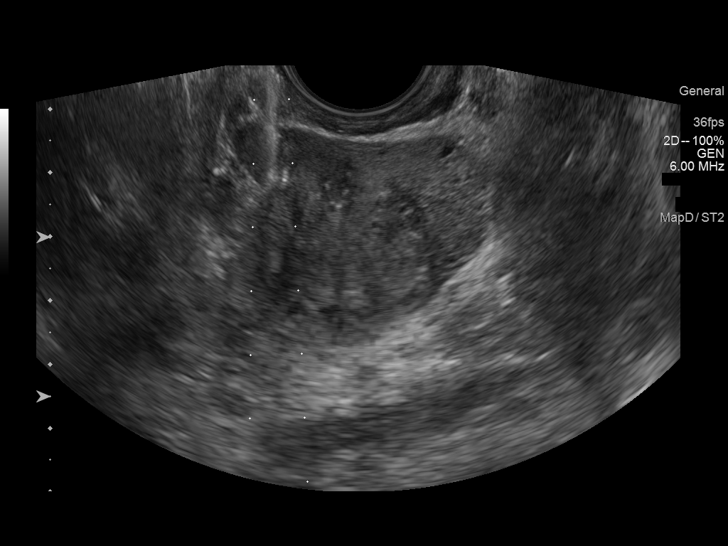
[im 33/44]
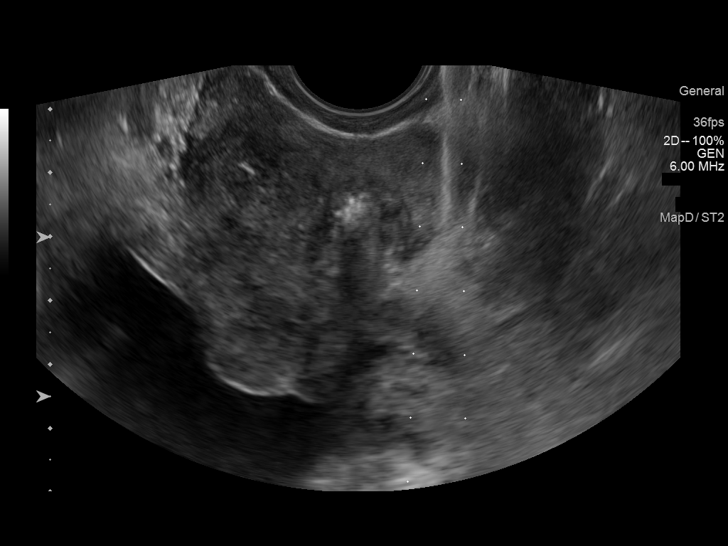
[im 36/44]
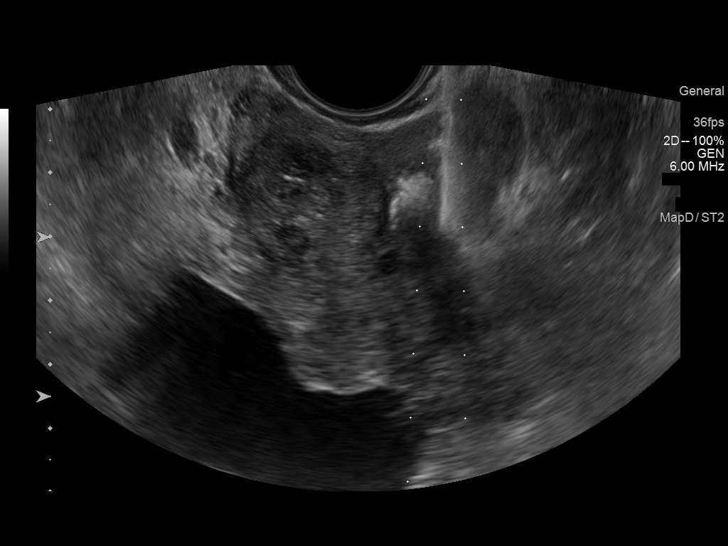
[im 40/44]
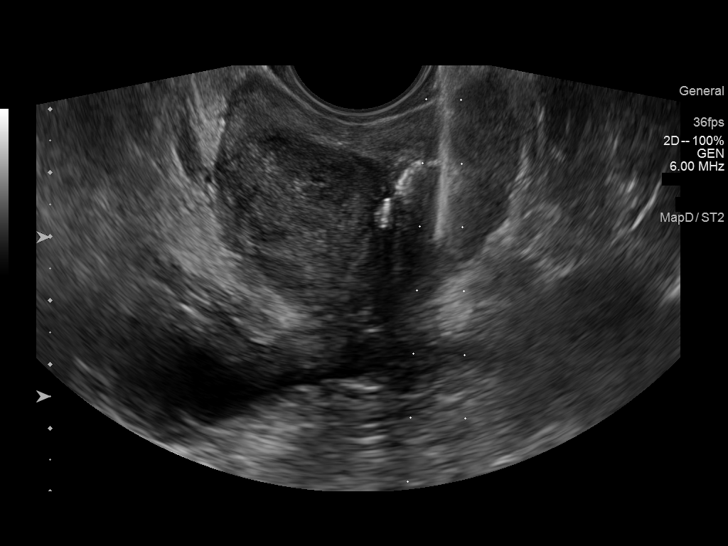
[im 44/44]
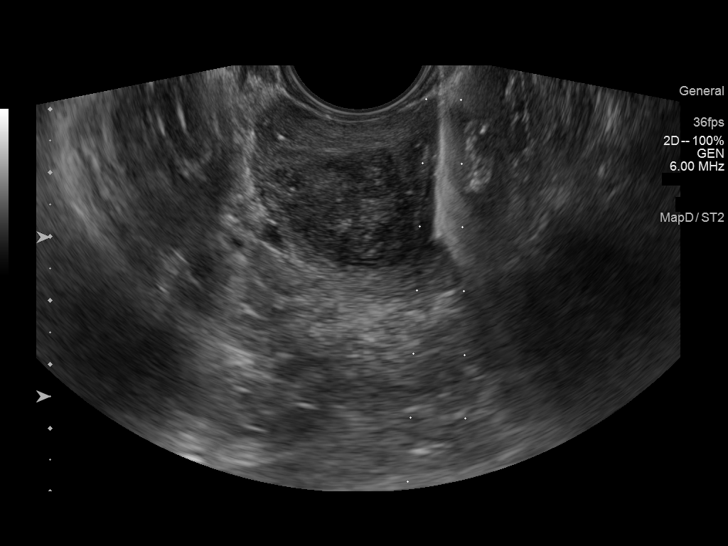

[14 of 25 positions shown; findings below may reference images not displayed]

FINDINGS: Images document intraoperative ultrasound provided for prostate
biopsy.
IMPRESSION: Intraoperative guidance

## 2016-09-17 ENCOUNTER — Ambulatory Visit (INDEPENDENT_AMBULATORY_CARE_PROVIDER_SITE_OTHER): Payer: Medicare Other | Admitting: Physician Assistant

## 2016-09-17 VITALS — BP 122/72 | HR 76 | Temp 97.6°F | Resp 17 | Ht 68.5 in | Wt 167.0 lb

## 2016-09-17 DIAGNOSIS — R42 Dizziness and giddiness: Secondary | ICD-10-CM

## 2016-09-17 DIAGNOSIS — H6983 Other specified disorders of Eustachian tube, bilateral: Secondary | ICD-10-CM | POA: Diagnosis not present

## 2016-09-17 DIAGNOSIS — H6993 Unspecified Eustachian tube disorder, bilateral: Secondary | ICD-10-CM

## 2016-09-17 LAB — POCT CBC
Granulocyte percent: 59 %G (ref 37–80)
HCT, POC: 45.8 % (ref 43.5–53.7)
Hemoglobin: 16.1 g/dL (ref 14.1–18.1)
Lymph, poc: 2 (ref 0.6–3.4)
MCH, POC: 30.4 pg (ref 27–31.2)
MCHC: 35.2 g/dL (ref 31.8–35.4)
MCV: 86.3 fL (ref 80–97)
MID (cbc): 0.5 (ref 0–0.9)
MPV: 5.9 fL (ref 0–99.8)
POC Granulocyte: 3.6 (ref 2–6.9)
POC LYMPH PERCENT: 33.2 %L (ref 10–50)
POC MID %: 7.8 % (ref 0–12)
Platelet Count, POC: 201 10*3/uL (ref 142–424)
RBC: 5.3 M/uL (ref 4.69–6.13)
RDW, POC: 13.2 %
WBC: 6.1 10*3/uL (ref 4.6–10.2)

## 2016-09-17 MED ORDER — IPRATROPIUM BROMIDE 0.03 % NA SOLN: 2.0000 | Freq: Two times a day (BID) | NASAL | 0 refills | Status: DC

## 2016-09-17 NOTE — Progress Notes (Signed)
Joshua Golden  MRN: [id number] DOB: 26-Jun-1946  PCP: No PCP Per Patient  Subjective:  "Joshua Golden" Joshua Golden is a pleasant 70 year old male, history of transurethral resection of the prostate, who presents to clinic for blocked ears x 3 weeks. He describes it as an ear fullness and notes his "equilibriums is off". He "feels wobbly". Disequlibrium happens more when he's up and walking, not present while sitting.  Notes left ear pain about a week and a half ago, has since resolved. Decreased hearing for two weeks.  Has had vertigo in the past, 6 or 8 years ago. Self-admin [employer] Hallpike maneuver, helped. This episode is different.   Denies headache, changes in vision or speech, one-sided weakness, numbness or tingling, light-headedness, runny nose, cough, chest pain, muscle weakness.   Prostate surgery last month.  Patient notes that his prostate is affected when he takes Anti-histamines.   Review of Systems  Constitutional: Negative for chills and fever.  HENT: Positive for ear pain and hearing loss. Negative for rhinorrhea, sinus pressure and tinnitus.   Eyes: Negative for visual disturbance.  Respiratory: Negative for cough, chest tightness and shortness of breath.   Cardiovascular: Negative for chest pain and palpitations.  Gastrointestinal: Negative for nausea and vomiting.  Allergic/Immunologic: Negative for environmental allergies and food allergies.  Neurological: Positive for dizziness. Negative for facial asymmetry, speech difficulty, weakness, light-headedness, numbness and headaches.  Psychiatric/Behavioral: Negative for confusion.    Patient Active Problem List   Diagnosis Date Noted  . Elevated PSA, less than 10 ng/ml 07/31/2016  . BPH (benign prostatic hypertrophy) with urinary retention 07/30/2016  . S/P rotator cuff repair 02/06/2012  . Complete rotator cuff tear or rupture of shoulder 02/05/2012    No current outpatient prescriptions on file prior to visit.   No current  facility-administered medications on file prior to visit.     No Known Allergies  Objective:  BP 122/72 (BP Location: Right Arm, Patient Position: Sitting, Cuff Size: Normal)   Pulse 76   Temp 97.6 F (36.4 C) (Oral)   Resp 17   Ht 5' 8.5" (1.74 m)   Wt 167 lb (75.8 kg)   SpO2 98%   BMI 25.02 kg/m   Physical Exam  Constitutional: He is oriented to person, place, and time and well-developed, well-nourished, and in no distress. No distress.  HENT:  Right Ear: Ear canal normal. Tympanic membrane is retracted. Tympanic membrane is not injected and not perforated.  Left Ear: Ear canal normal. Tympanic membrane is retracted. Tympanic membrane is not injected and not perforated. Decreased hearing (chronic) is noted.  Nose: Mucosal edema present. No rhinorrhea or sinus tenderness.  Eyes: EOM are normal. Pupils are equal, round, and reactive to light.  Neck: Normal range of motion. Neck supple.  Cardiovascular: Normal rate, regular rhythm and normal heart sounds.   Pulmonary/Chest: Effort normal and breath sounds normal. No respiratory distress. He has no wheezes. He has no rales.  Lymphadenopathy:       Head (right side): No preauricular and no posterior auricular adenopathy present.       Head (left side): No preauricular and no posterior auricular adenopathy present.  Neurological: He is alert and oriented to person, place, and time. He has normal sensation, normal strength and intact cranial nerves. 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 He displays no weakness, no atrophy and normal speech. No sensory deficit. He exhibits normal muscle tone. Gait normal.  Gait normal. GCS score is 15.  Skin: Skin is warm and dry.  Psychiatric: Mood, memory, affect and judgment normal.  Vitals reviewed.   Results for orders placed or performed in visit on 09/17/16  POCT CBC  Result Value Ref Range   WBC 6.1 4.6 - 10.2 K/uL    Lymph, poc 2.0 0.6 - 3.4   POC LYMPH PERCENT 33.2 10 - 50 %L   MID (cbc) 0.5 0 - 0.9   POC MID % 7.8 0 - 12 %M   POC Granulocyte 3.6 2 - 6.9   Granulocyte percent 59.0 37 - 80 %G   RBC 5.30 4.69 - 6.13 M/uL   Hemoglobin 16.1 14.1 - 18.1 g/dL   HCT, POC 16.1 09.6 - 53.7 %   MCV 86.3 80 - 97 fL   MCH, POC 30.4 27 - 31.2 pg   MCHC 35.2 31.8 - 35.4 g/dL   RDW, POC 04.5 %   Platelet Count, POC 201 142 - 424 K/uL   MPV 5.9 0 - 99.8 fL    Assessment and Plan :  1. Dysfunction of both eustachian tubes 2. Dizziness - ipratropium (ATROVENT) 0.03 % nasal spray; Place 2 sprays into both nostrils 2 (two) times daily.  Dispense: 30 mL; Refill: 0 - POCT CBC - Patient education: Stay well hydrated, do not clean ears with Q-tips, use nasal spray as directed. He will RTC if no improvement in 2-4 weeks. Patient understands and agrees with plan.     Marco Collie, PA-C  [facility] 09/17/2016 9:46 AM

## 2016-09-17 NOTE — Patient Instructions (Addendum)
Please use your Ipratropium nasal spray as directed. It may take a week to start working, so don't quit if you are not feeling better in the next few days. Please return to the clinic if you are not seeing improvement in three weeks. Please stay well hydrated, as this will help your symptoms. Drink at least 2 Liters of water/day.  Please see below for general information on Eustachian tube dysfunction.  If you notice you have these symptoms during seasonal changes, you can use this therapy one week before the start of seasonal change. If you have associated runny nose and sneezing, you can use Flonase as this has combined anti-histamine (at your pharmacy). Since this is a local anti-histamine, it should not affect your prostate.   Thank you for coming in today. I hope you feel we met your needs.  Feel free to call [facility] if you have any questions or further requests.  Please consider signing up for MyChart if you do not already have it, as this is a great way to communicate with me.  Best,  Whitney McVey, PA-C  IF you received an x-ray today, you will receive an invoice from [facility]. Please contact [facility] at [phone] with questions or concerns regarding your invoice.   IF you received labwork today, you will receive an invoice from [employer]. Please contact [facility] at [phone] with questions or concerns regarding your invoice.   Our billing staff will not be able to assist you with questions regarding bills from these companies.  You will be contacted with the lab results as soon as they are available. The fastest way to get your results is to activate your My Chart account. Instructions are located on the last page of this paperwork. If you have not heard from [place] regarding the results in 2 weeks, please contact this office.      Barotitis Media Barotitis media is inflammation of your middle ear. This occurs when the auditory tube  (eustachian tube) leading from the back of your nose (nasopharynx) to your eardrum is blocked. This blockage may result from a cold, environmental allergies, or an upper respiratory infection. Unresolved barotitis media may lead to damage or hearing loss (barotrauma), which may become permanent. HOME CARE INSTRUCTIONS   Use medicines as recommended by your health care provider. Over-the-counter medicines will help unblock the canal and can help during times of air travel.  Do not put anything into your ears to clean or unplug them. Eardrops will not be helpful.  Do not swim, dive, or fly until your health care provider says it is all right to do so. If these activities are necessary, chewing gum with frequent, forceful swallowing may help. It is also helpful to hold your nose and gently blow to pop your ears for equalizing pressure changes. This forces air into the eustachian tube.  Only take over-the-counter or prescription medicines for pain, discomfort, or fever as directed by your health care provider.  A decongestant may be helpful in decongesting the middle ear and make pressure equalization easier. SEEK MEDICAL CARE IF:  You experience a serious form of dizziness in which you feel as if the room is spinning and you feel nauseated (vertigo).  Your symptoms only involve one ear. SEEK IMMEDIATE MEDICAL CARE IF:   You develop a severe headache, dizziness, or severe ear pain.  You have bloody or pus-like drainage from your ears.  You develop a fever.  Your problems do not improve or  become worse. MAKE SURE YOU:   Understand these instructions.  Will watch your condition.  Will get help right away if you are not doing well or get worse.   This information is not intended to replace advice given to you by your health care provider. Make sure you discuss any questions you have with your health care provider.   Document Released: 11/22/2000 Document Revised: 09/15/2013 Document  Reviewed: 06/22/2013 Elsevier Interactive Patient Education [employer].

## 2016-09-30 ENCOUNTER — Ambulatory Visit (INDEPENDENT_AMBULATORY_CARE_PROVIDER_SITE_OTHER): Payer: Medicare Other | Admitting: Family Medicine

## 2016-09-30 VITALS — BP 138/70 | HR 79 | Temp 98.3°F | Resp 17 | Ht 68.5 in | Wt 164.0 lb

## 2016-09-30 DIAGNOSIS — R42 Dizziness and giddiness: Secondary | ICD-10-CM | POA: Diagnosis not present

## 2016-09-30 DIAGNOSIS — H6983 Other specified disorders of Eustachian tube, bilateral: Secondary | ICD-10-CM | POA: Diagnosis not present

## 2016-09-30 DIAGNOSIS — R030 Elevated blood-pressure reading, without diagnosis of hypertension: Secondary | ICD-10-CM

## 2016-09-30 NOTE — Patient Instructions (Signed)
     IF you received an x-ray today, you will receive an invoice from [facility]. Please contact [facility] at [phone] with questions or concerns regarding your invoice.   IF you received labwork today, you will receive an invoice from [facility]. Please contact [facility] at [phone] with questions or concerns regarding your invoice.   Our billing staff will not be able to assist you with questions regarding bills from these companies.  You will be contacted with the lab results as soon as they are available. The fastest way to get your results is to activate your My Chart account. Instructions are located on the last page of this paperwork. If you have not heard from us regarding the results in 2 weeks, please contact this office.      

## 2016-09-30 NOTE — Progress Notes (Signed)
Chief Complaint  Patient presents with  . patient states his ears feel blocked  . Hypertension    HPI   Eustachian Tube Disorder Disequilibrium Pt reports that he has had this left ear pain and bilateral eat fullness with a constant pressure in his ears which makes him feel off balance He was seen a week ago and given atrovent nasal spray without improvement Reports that he had a lot of sinus infections in the past, at least twice a year, but in the past 3 years has not had many infections Reports that he took antihistamines in the past and would also get a swollen prostate so he has not taken antihistamines Denies sensation that the room is spinning No tinnitus Reports decreased hearing  Elevated Blood Pressure: Patient here for follow-up of elevated blood pressure.  He does not check his blood pressures at home. He avoids salty foods. He is a diet controlled.  He thinks his blood pressure today is because he is irritated.   He reports that typically when he is in pain his blood pressure is higher.  BP Readings from Last 3 Encounters:  09/30/16 138/70  09/17/16 122/72  07/31/16 132/83     Past Medical History:  Diagnosis Date  . Allergy   . Arthritis    arthritis- fingers  . Hypertrophy of prostate with urinary retention    in and out self catheterization x3 daily at present  . 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 UTI (urinary tract infection)    07-25-16 being tx now    Current Outpatient Prescriptions  Medication Sig Dispense Refill  . ipratropium (ATROVENT) 0.03 % nasal spray Place 2 sprays into both nostrils 2 (two) times daily. 30 mL 0   No current facility-administered medications for this visit.     Allergies: No Known Allergies  Past Surgical History:  Procedure Laterality Date  . CYSTOSCOPY     x1 in office.  Marland Kitchen HERNIA REPAIR     left inguinal hernia repair  . PROSTATE BIOPSY N/A 07/30/2016   Procedure: BIOPSY TRANSRECTAL  ULTRASONIC PROSTATE (TUBP);  Surgeon: Bjorn Pippin, MD;  Location: [facility];  Service: Urology;  Laterality: N/A;  . SHOULDER OPEN ROTATOR CUFF REPAIR  02/05/2012   Procedure: ROTATOR CUFF REPAIR SHOULDER OPEN;  Surgeon: Jacki Cones, MD;  Location: [facility];  Service: Orthopedics;  Laterality: Right;  . TONSILLECTOMY    . TRANSURETHRAL RESECTION OF PROSTATE N/A 07/30/2016   Procedure: TRANSURETHRAL RESECTION OF THE PROSTATE (TURP);  Surgeon: Bjorn Pippin, MD;  Location: [facility];  Service: Urology;  Laterality: N/A;  . VASECTOMY      Social History   Social History  . Marital status: Married    Spouse name: N/A  . Number of children: N/A  . Years of education: N/A   Social History Main Topics  . Smoking status: Former Smoker    Years: 6.00    Types: Cigarettes    Quit date: 07/25/1973  . Smokeless tobacco: Never Used  . Alcohol use 0.0 oz/week     Comment: occasional beer/wine  . Drug use: No  . Sexual activity: Yes   Other Topics Concern  . None   Social History Narrative  . None    ROS See hpi    Objective: Vitals:   09/30/16 0838 09/30/16 0911  BP: (!) 142/92 138/70  Pulse: 79   Resp: 17   Temp: 98.3 F (36.8 C)  TempSrc: Oral   SpO2: 98%   Weight: 164 lb (74.4 kg)   Height: 5' 8.5" (1.74 m)     Physical Exam General: alert, oriented, in NAD Head: normocephalic, atraumatic, no sinus tenderness Eyes: EOM intact, no scleral icterus or conjunctival injection Ears: TM clear bilaterally Throat: no pharyngeal exudate or erythema Lymph: no posterior auricular, submental or cervical lymph adenopathy Heart: normal rate, normal sinus rhythm, no murmurs Lungs: clear to auscultation bilaterally, no wheezing   Assessment and Plan Sharon SellerLloyd was seen today for patient states his ears feel blocked and hypertension.  Diagnoses and all orders for this visit:  Dysfunction of both eustachian tubes- advised to try vicks sinex otc decongestant Advised ENT follow up for  further evaluation Review of whether or not systemic steroids would be appropriate showed more risk than benefit -     Ambulatory referral to ENT  Disequilibrium- gave fall precautions given balance concern -     Ambulatory referral to ENT  Elevated blood pressure reading- improved on recheck at the end of visit     Corbett Moulder A Creta LevinStallings

## 2023-05-27 ENCOUNTER — Ambulatory Visit
Admission: EM | Admit: 2023-05-27 | Discharge: 2023-05-27 | Disposition: A | Discharge status: Home / Self Care | Payer: Medicare PPO | Attending: Physician Assistant | Admitting: Physician Assistant

## 2023-05-27 DIAGNOSIS — H6691 Otitis media, unspecified, right ear: Secondary | ICD-10-CM | POA: Diagnosis not present

## 2023-05-27 MED ORDER — AMOXICILLIN 500 MG PO CAPS: 500.0000 mg | ORAL_CAPSULE | Freq: Three times a day (TID) | ORAL | 0 refills | Status: DC

## 2023-05-27 NOTE — ED Triage Notes (Signed)
Pt is here for right ear pain x 3days  

## 2023-05-27 NOTE — ED Provider Notes (Signed)
[facility]    CSN: [id number] Arrival date & time: 05/27/23  1001      History   Chief Complaint Chief Complaint  Patient presents with   Otalgia    HPI Joshua Golden is a 77 y.o. male.   Complains of discomfort in his right ear for the past 2 weeks patient reports he has a crackling popping sensation in his right ear.  Patient reports his ear feels uncomfortable like there is something in it.  Patient denies any fever chills or cough  The history is provided by the patient. No language interpreter was used.  Otalgia   Past Medical History:  Diagnosis Date   Allergy    Arthritis    arthritis- fingers   Hypertrophy of prostate with urinary retention    in and out self catheterization x3 daily at present   Thrombocytopenia (HCC)    hx of in third grade no problems now    UTI (urinary tract infection)    07-25-16 being tx now    Patient Active Problem List   Diagnosis Date Noted   Elevated PSA, less than 10 ng/ml 07/31/2016    Past Surgical History:  Procedure Laterality Date   CYSTOSCOPY     x1 in office.   HERNIA REPAIR     left inguinal hernia repair   PROSTATE BIOPSY N/A 07/30/2016   Procedure: BIOPSY TRANSRECTAL ULTRASONIC PROSTATE (TUBP);  Surgeon: Bjorn Pippin, MD;  Location: [facility];  Service: Urology;  Laterality: N/A;   SHOULDER OPEN ROTATOR CUFF REPAIR  02/05/2012   Procedure: ROTATOR CUFF REPAIR SHOULDER OPEN;  Surgeon: Jacki Cones, MD;  Location: [facility];  Service: Orthopedics;  Laterality: Right;   TONSILLECTOMY     TRANSURETHRAL RESECTION OF PROSTATE N/A 07/30/2016   Procedure: TRANSURETHRAL RESECTION OF THE PROSTATE (TURP);  Surgeon: Bjorn Pippin, MD;  Location: [facility];  Service: Urology;  Laterality: N/A;   VASECTOMY         Home Medications    Prior to Admission medications   Medication Sig Start Date End Date Taking? Authorizing Provider  amoxicillin (AMOXIL) 500 MG capsule Take 1 capsule (500 mg total) by mouth 3 (three)  times daily. 05/27/23  Yes Cheron Schaumann K, PA-C  ipratropium (ATROVENT) 0.03 % nasal spray Place 2 sprays into both nostrils 2 (two) times daily. 09/17/16   McVey, Madelaine Bhat, PA-C    Family History Family History  Problem Relation Age of Onset   Cancer Sister        per patient of the cancer of tongue    Social History Social History   Tobacco Use   Smoking status: Former    Years: 6    Types: Cigarettes    Quit date: 07/25/1973    Years since quitting: 49.8   Smokeless tobacco: Never  Substance Use Topics   Alcohol use: Yes    Alcohol/week: 0.0 standard drinks of alcohol    Comment: occasional beer/wine   Drug use: No     Allergies   Patient has no known allergies.   Review of Systems Review of Systems  HENT:  Positive for ear pain.   All other systems reviewed and are negative.    Physical Exam Triage Vital Signs ED Triage Vitals  Enc Vitals Group     BP 05/27/23 1054 (!) 176/82     Pulse Rate 05/27/23 1054 86     Resp 05/27/23 1054 16     Temp 05/27/23 1048 98.2 F (  36.8 C)     Temp Source 05/27/23 1048 Oral     SpO2 05/27/23 1054 96 %     Weight --      Height --      Head Circumference --      Peak Flow --      Pain Score --      Pain Loc --      Pain Edu? --      Excl. in GC? --    No data found.  Updated Vital Signs BP (!) 176/82 (BP Location: Left Arm)   Pulse 62   Temp 98.2 F (36.8 C) (Oral)   Resp 16   SpO2 93%   Visual Acuity Right Eye Distance:   Left Eye Distance:   Bilateral Distance:    Right Eye Near:   Left Eye Near:    Bilateral Near:     Physical Exam Vitals and nursing note reviewed.  Constitutional:      Appearance: He is well-developed.  HENT:     Head: Normocephalic.     Ears:     Comments: TM is erythematous poor landmarks. Cardiovascular:     Rate and Rhythm: Normal rate.  Pulmonary:     Effort: Pulmonary effort is normal.  Abdominal:     General: There is no distension.  Musculoskeletal:         General: Normal range of motion.     Cervical back: Normal range of motion.  Skin:    General: Skin is warm.  Neurological:     General: No focal deficit present.     Mental Status: He is alert and oriented to person, place, and time.      UC Treatments / Results  Labs (all labs ordered are listed, but only abnormal results are displayed) Labs Reviewed - No data to display  EKG   Radiology No results found.  Procedures Procedures (including critical care time)  Medications Ordered in UC Medications - No data to display  Initial Impression / Assessment and Plan / UC Course  I have reviewed the triage vital signs and the nursing notes.  Pertinent labs & imaging results that were available during my care of the patient were reviewed by me and considered in my medical decision making (see chart for details).     Patient is given a prescription for amoxicillin he is advised if symptoms do not improve he should return for recheck and possible referral to ENT Final Clinical Impressions(s) / UC Diagnoses   Final diagnoses:  Right otitis media, unspecified otitis media type     Discharge Instructions      Return if any problems.    ED Prescriptions     Medication Sig Dispense Auth. Provider   amoxicillin (AMOXIL) 500 MG capsule Take 1 capsule (500 mg total) by mouth 3 (three) times daily. 30 capsule Elson Areas, [place]      PDMP not reviewed this encounter. An After Visit Summary was printed and given to the patient.       Elson Areas, [place] 05/27/23 1344

## 2023-05-27 NOTE — Discharge Instructions (Signed)
Return if any problems.

## 2023-08-26 ENCOUNTER — Other Ambulatory Visit: Payer: Self-pay | Admitting: Otolaryngology

## 2023-08-26 DIAGNOSIS — H903 Sensorineural hearing loss, bilateral: Secondary | ICD-10-CM

## 2023-09-01 ENCOUNTER — Other Ambulatory Visit: Payer: Self-pay | Admitting: Otolaryngology

## 2023-09-01 DIAGNOSIS — H903 Sensorineural hearing loss, bilateral: Secondary | ICD-10-CM

## 2023-09-10 ENCOUNTER — Ambulatory Visit
Admission: RE | Admit: 2023-09-10 | Discharge: 2023-09-10 | Disposition: A | Discharge status: Home / Self Care | Payer: Medicare PPO | Source: Ambulatory Visit | Attending: Otolaryngology

## 2023-09-10 ENCOUNTER — Ambulatory Visit
Admission: RE | Admit: 2023-09-10 | Discharge: 2023-09-10 | Disposition: A | Discharge status: Home / Self Care | Payer: Medicare PPO | Source: Ambulatory Visit | Attending: Otolaryngology | Admitting: Otolaryngology

## 2023-09-10 DIAGNOSIS — H903 Sensorineural hearing loss, bilateral: Secondary | ICD-10-CM

## 2023-09-10 MED ORDER — GADOPICLENOL 0.5 MMOL/ML IV SOLN
8.0000 mL | Freq: Once | INTRAVENOUS | Status: AC | PRN
  Administered 2023-09-10: 8 mL via INTRAVENOUS

## 2023-10-07 ENCOUNTER — Other Ambulatory Visit: Payer: Medicare PPO

## 2024-04-28 NOTE — Progress Notes (Signed)
 Joshua Golden is a 78 y.o.  with  reports that he has quit smoking. He has never used smokeless tobacco. with  Active Ambulatory Problems    Diagnosis Date Noted  . ETD (Eustachian tube dysfunction), bilateral 10/02/2016  . Chronic seasonal allergic rhinitis 10/02/2016  . Disequilibrium 10/02/2016   Resolved Ambulatory Problems    Diagnosis Date Noted  . No Resolved Ambulatory Problems   Past Medical History:  Diagnosis Date  . H/O thrombocytopenia    who presents today at Urgent Care for  Chief Complaint  Patient presents with  . Wrist Pain    Wrist pain that started yesterday. Pt is reported a knot on left wrist. Denies any recent trauma. Denies any recent fever. Denies taking any medications.       History of Present Illness The patient is a 78 year old male who presents with a cyst on his left wrist.  He reports the presence of a knot on his left wrist, which he first observed yesterday. He does not recall any specific trauma to the area. He has not experienced any recent fevers and has not taken any medications for this condition. 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 He quantifies his pain as a 3 to 4 on a scale of 10. 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      Patient has no co-morbidities  or medications that might increase the risk for developing a severe infection     reports that he has quit smoking. He has never used smokeless tobacco.  Review of Systems  Review of systems is otherwise negative except as noted in the HPI and Assessment/MDM   Physical Exam  BP (!) 183/90   Pulse 64   Temp 96.6 F (35.9 C) (Tympanic)   Resp 16   Ht 1.727 m (5' 8)   Wt 72.6 kg (160 lb)   SpO2 100%   BMI 24.33 kg/m   Constitutional:       General: Patient is not in acute distress.    Appearance: Normal appearance.  Neurological:     General: No focal deficit present.     Mental Status: alert and oriented to person, place, and time.  HENT:     Eyes:     Conjunctiva/sclera: Conjunctivae normal. Pharynx normal    There is no associated anterior cervical lymphadenopathy    Pupils: Pupils are equal, round, and reactive to light.     TM's normal with no visible effusion  Cardiovascular:     Heart sounds: Normal heart sounds. No murmur heard.    No Lower extremity edema noted Pulmonary:     Effort: Pulmonary effort is normal. No respiratory distress.     Breath sounds: No wheezing, rhonchi or rales.  Abdominal:     General: Bowel sounds are normal. There is no distension.     Tenderness: There is no abdominal tenderness.  Musculoskeletal:        General: Normal range of motion.     Neck:  No rigidity.  Skin:    General: Skin is warm and dry.  Psychiatric:        Mood and Affect: Mood normal.   XR Wrist 2 Views Left  Final Result by Rankin Ina Slice, MD [phone])  X-RAY WRIST LEFT (2 VIEWS), 04/28/2024 8:24 AM    INDICATION: Other specified joint disorders, left wrist \ F74.167 Other   specified joint disorders, left wrist   COMPARISON: None.    IMPRESSION:  1.  No acute fracture.   2.  No malalignment. Ulnar positive variance.  3.  Moderate first CMC and mild STT, radiocarpal, ulnocarpal and DRUJ   degenerative changes.   4.  MIld dorsal wrist soft tissue edema.               DIAGNOSIS/PLAN     1. Mass of joint of left wrist  XR Wrist 2 Views Left      Assessment & Plan 1. Ganglion cyst. The patient presents with a ganglion cyst on the left wrist, which he noticed yesterday. The cyst is associated with mild pain, rated 3-4/10, and appears to fluctuate in size based on activity. An x-ray of the wrist shows mild arthritis and a small bone spur at the distal radial ulnar joint, but no  significant arthritis. The cyst is likely due to fluid escaping from the wrist capsule. Treatment options discussed include oral medications, wrist injections, and surgical removal. He prefers to manage the condition conservatively. Ibuprofen is recommended to reduce inflammation. A wrist compression bandage is advised to help alleviate symptoms. If the cyst becomes bothersome, further interventions such as drainage or steroid injections can be considered, and referral to an orthopedic surgeon may be necessary.    We discussed risks and side effects of medications, and also discussed red flags which would warrant immediate follow-up.   Urgent Care Disposition:  Home Care

## 2024-10-14 ENCOUNTER — Encounter (HOSPITAL_COMMUNITY): Payer: Self-pay | Admitting: Internal Medicine

## 2024-10-14 ENCOUNTER — Inpatient Hospital Stay (HOSPITAL_COMMUNITY)

## 2024-10-14 ENCOUNTER — Emergency Department (HOSPITAL_COMMUNITY)

## 2024-10-14 ENCOUNTER — Other Ambulatory Visit: Payer: Self-pay

## 2024-10-14 ENCOUNTER — Observation Stay (HOSPITAL_COMMUNITY)
Admission: EM | Admit: 2024-10-14 | Discharge: 2024-10-15 | Disposition: A | Discharge status: Home / Self Care | Attending: Internal Medicine | Admitting: Internal Medicine

## 2024-10-14 DIAGNOSIS — E785 Hyperlipidemia, unspecified: Secondary | ICD-10-CM | POA: Diagnosis not present

## 2024-10-14 DIAGNOSIS — I639 Cerebral infarction, unspecified: Principal | ICD-10-CM | POA: Diagnosis present

## 2024-10-14 DIAGNOSIS — I7090 Unspecified atherosclerosis: Secondary | ICD-10-CM

## 2024-10-14 DIAGNOSIS — Z7901 Long term (current) use of anticoagulants: Secondary | ICD-10-CM | POA: Diagnosis not present

## 2024-10-14 DIAGNOSIS — F1090 Alcohol use, unspecified, uncomplicated: Secondary | ICD-10-CM | POA: Diagnosis not present

## 2024-10-14 DIAGNOSIS — Z79899 Other long term (current) drug therapy: Secondary | ICD-10-CM | POA: Insufficient documentation

## 2024-10-14 DIAGNOSIS — Z87891 Personal history of nicotine dependence: Secondary | ICD-10-CM | POA: Insufficient documentation

## 2024-10-14 DIAGNOSIS — Z7982 Long term (current) use of aspirin: Secondary | ICD-10-CM | POA: Insufficient documentation

## 2024-10-14 DIAGNOSIS — R29706 NIHSS score 6: Secondary | ICD-10-CM | POA: Diagnosis not present

## 2024-10-14 DIAGNOSIS — I6389 Other cerebral infarction: Principal | ICD-10-CM | POA: Insufficient documentation

## 2024-10-14 DIAGNOSIS — I129 Hypertensive chronic kidney disease with stage 1 through stage 4 chronic kidney disease, or unspecified chronic kidney disease: Secondary | ICD-10-CM | POA: Insufficient documentation

## 2024-10-14 DIAGNOSIS — R278 Other lack of coordination: Secondary | ICD-10-CM | POA: Diagnosis present

## 2024-10-14 DIAGNOSIS — I6340 Cerebral infarction due to embolism of unspecified cerebral artery: Secondary | ICD-10-CM | POA: Diagnosis not present

## 2024-10-14 DIAGNOSIS — N1831 Chronic kidney disease, stage 3a: Secondary | ICD-10-CM | POA: Diagnosis not present

## 2024-10-14 LAB — COMPREHENSIVE METABOLIC PANEL WITH GFR
ALT: 10 U/L (ref 0–44)
AST: 19 U/L (ref 15–41)
Albumin: 3.8 g/dL (ref 3.5–5.0)
Alkaline Phosphatase: 62 U/L (ref 38–126)
Anion gap: 9 (ref 5–15)
BUN: 22 mg/dL (ref 8–23)
CO2: 22 mmol/L (ref 22–32)
Calcium: 8.5 mg/dL — ABNORMAL LOW (ref 8.9–10.3)
Chloride: 108 mmol/L (ref 98–111)
Creatinine, Ser: 1.63 mg/dL — ABNORMAL HIGH (ref 0.61–1.24)
GFR, Estimated: 43 mL/min — ABNORMAL LOW (ref >60)
Glucose, Bld: 94 mg/dL (ref 70–99)
Potassium: 4.6 mmol/L (ref 3.5–5.1)
Sodium: 139 mmol/L (ref 135–145)
Total Bilirubin: 0.8 mg/dL (ref 0.0–1.2)
Total Protein: 6.9 g/dL (ref 6.5–8.1)

## 2024-10-14 LAB — DIFFERENTIAL
Abs Immature Granulocytes: 0.03 K/uL (ref 0.00–0.07)
Basophils Absolute: 0 K/uL (ref 0.0–0.1)
Basophils Relative: 0 %
Eosinophils Absolute: 0.2 K/uL (ref 0.0–0.5)
Eosinophils Relative: 2 %
Immature Granulocytes: 0 %
Lymphocytes Relative: 28 %
Lymphs Abs: 2.2 K/uL (ref 0.7–4.0)
Monocytes Absolute: 0.5 K/uL (ref 0.1–1.0)
Monocytes Relative: 6 %
Neutro Abs: 5.1 K/uL (ref 1.7–7.7)
Neutrophils Relative %: 64 %

## 2024-10-14 LAB — HEMOGLOBIN A1C
Hgb A1c MFr Bld: 5 % (ref 4.8–5.6)
Mean Plasma Glucose: 96.8 mg/dL

## 2024-10-14 LAB — CREATININE, SERUM
Creatinine, Ser: 1.62 mg/dL — ABNORMAL HIGH (ref 0.61–1.24)
GFR, Estimated: 43 mL/min — ABNORMAL LOW (ref >60)

## 2024-10-14 LAB — I-STAT CHEM 8, ED
BUN: 25 mg/dL — ABNORMAL HIGH (ref 8–23)
Calcium, Ion: 1.28 mmol/L (ref 1.15–1.40)
Chloride: 107 mmol/L (ref 98–111)
Creatinine, Ser: 1.7 mg/dL — ABNORMAL HIGH (ref 0.61–1.24)
Glucose, Bld: 93 mg/dL (ref 70–99)
HCT: 44 % (ref 39.0–52.0)
Hemoglobin: 15 g/dL (ref 13.0–17.0)
Potassium: 4.8 mmol/L (ref 3.5–5.1)
Sodium: 142 mmol/L (ref 135–145)
TCO2: 26 mmol/L (ref 22–32)

## 2024-10-14 LAB — CBC
HCT: 45.2 % (ref 39.0–52.0)
Hemoglobin: 15 g/dL (ref 13.0–17.0)
MCH: 29.9 pg (ref 26.0–34.0)
MCHC: 33.2 g/dL (ref 30.0–36.0)
MCV: 90 fL (ref 80.0–100.0)
Platelets: 180 K/uL (ref 150–400)
RBC: 5.02 MIL/uL (ref 4.22–5.81)
RDW: 12.4 % (ref 11.5–15.5)
WBC: 8 K/uL (ref 4.0–10.5)
nRBC: 0 % (ref 0.0–0.2)

## 2024-10-14 LAB — PROTIME-INR
INR: 0.9 (ref 0.8–1.2)
Prothrombin Time: 13.1 s (ref 11.4–15.2)

## 2024-10-14 LAB — CBG MONITORING, ED: Glucose-Capillary: 93 mg/dL (ref 70–99)

## 2024-10-14 LAB — APTT: aPTT: 33 s (ref 24–36)

## 2024-10-14 LAB — ETHANOL: Alcohol, Ethyl (B): <15 mg/dL (ref <15)

## 2024-10-14 MED ORDER — IOHEXOL 350 MG/ML SOLN
75.0000 mL | Freq: Once | INTRAVENOUS | Status: AC | PRN
Start: 2024-10-14 — End: 2024-10-14
  Administered 2024-10-14: 75 mL via INTRAVENOUS

## 2024-10-14 MED ORDER — SODIUM CHLORIDE 0.9% FLUSH
3.0000 mL | Freq: Once | INTRAVENOUS | Status: AC
  Administered 2024-10-14: 3 mL via INTRAVENOUS

## 2024-10-14 MED ORDER — ACETAMINOPHEN 650 MG RE SUPP: 650.0000 mg | RECTAL | Status: DC | PRN

## 2024-10-14 MED ORDER — ASPIRIN 81 MG PO TBEC
81.0000 mg | DELAYED_RELEASE_TABLET | Freq: Every day | ORAL | Status: DC
  Administered 2024-10-14 – 2024-10-15 (×2): 81 mg via ORAL
  Filled 2024-10-14 (×2): qty 1

## 2024-10-14 MED ORDER — ACETAMINOPHEN 160 MG/5ML PO SOLN: 650.0000 mg | ORAL | Status: DC | PRN

## 2024-10-14 MED ORDER — SENNOSIDES-DOCUSATE SODIUM 8.6-50 MG PO TABS: 2.0000 | ORAL_TABLET | Freq: Every evening | ORAL | Status: DC | PRN

## 2024-10-14 MED ORDER — STROKE: EARLY STAGES OF RECOVERY BOOK
Freq: Once | Status: AC
  Filled 2024-10-14: qty 1

## 2024-10-14 MED ORDER — SODIUM CHLORIDE 0.9 % IV SOLN: INTRAVENOUS | Status: AC

## 2024-10-14 MED ORDER — CLEVIDIPINE BUTYRATE 0.5 MG/ML IV EMUL: 0.0000 mg/h | INTRAVENOUS | Status: DC

## 2024-10-14 MED ORDER — CLOPIDOGREL BISULFATE 75 MG PO TABS
75.0000 mg | ORAL_TABLET | Freq: Every day | ORAL | Status: DC
  Administered 2024-10-14 – 2024-10-15 (×2): 75 mg via ORAL
  Filled 2024-10-14 (×2): qty 1

## 2024-10-14 MED ORDER — ACETAMINOPHEN 325 MG PO TABS: 650.0000 mg | ORAL_TABLET | ORAL | Status: DC | PRN

## 2024-10-14 MED ORDER — SODIUM CHLORIDE 0.9 % IV BOLUS
500.0000 mL | Freq: Once | INTRAVENOUS | Status: AC
  Administered 2024-10-14: 500 mL via INTRAVENOUS

## 2024-10-14 MED ORDER — ENOXAPARIN SODIUM 40 MG/0.4ML IJ SOSY
40.0000 mg | PREFILLED_SYRINGE | INTRAMUSCULAR | Status: DC
  Administered 2024-10-14 – 2024-10-15 (×2): 40 mg via SUBCUTANEOUS
  Filled 2024-10-14 (×2): qty 0.4

## 2024-10-14 NOTE — H&P (Signed)
 History and Physical    Patient: Joshua Golden FMW:[id number] DOB: 1946/10/05 DOA: 10/14/2024 DOS: the patient was seen and examined on 10/14/2024 PCP: Patient, No Pcp Per  Patient coming from: Home  Chief Complaint:  Chief Complaint  Patient presents with   Code Stroke   HPI: Joshua Golden is a 78 y.o. male with no significant past medical history-brought to the hospital today for incoordination-difficulty with urination that started around 3 PM yesterday afternoon.  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  Spouse notes that he was struggling to choose the grade of gas/fuel-per patient-he has difficulty seeing in his lower visual field-and hence he was struggling and appeared incoordinated.  Wife thinks that he also has a slight right facial droop.  He was again having issues with incoordination while attempting to use his stove this morning.  He was subsequently brought to the ED-where CT imaging was suggestive of right parietal and occipital stroke.  The hospitalist service was then asked to admit this patient for further evaluation and treatment.  No history of fever No chest pain or shortness of breath No nausea, vomiting or diarrhea No abdominal pain No hematuria/dysuria/hematochezia.   Past Surgical History:  Procedure Laterality Date   CYSTOSCOPY     x1 in office.   HERNIA REPAIR     left inguinal hernia repair   PROSTATE BIOPSY N/A 07/30/2016   Procedure: BIOPSY TRANSRECTAL ULTRASONIC PROSTATE (TUBP);  Surgeon: Norleen Seltzer, MD;  Location: [facility];  Service: Urology;  Laterality: N/A;   SHOULDER OPEN ROTATOR CUFF REPAIR  02/05/2012   Procedure: ROTATOR CUFF REPAIR SHOULDER OPEN;  Surgeon: Tanda DELENA Heading, MD;  Location: [facility];  Service: Orthopedics;  Laterality: Right;   TONSILLECTOMY     TRANSURETHRAL RESECTION OF PROSTATE N/A 07/30/2016    Procedure: TRANSURETHRAL RESECTION OF THE PROSTATE (TURP);  Surgeon: Norleen Seltzer, MD;  Location: [facility];  Service: Urology;  Laterality: N/A;   VASECTOMY     Social History:  reports that he quit smoking about 51 years ago. His smoking use included cigarettes. He started smoking about 57 years ago. He has never used smokeless tobacco. He reports current alcohol use. He reports that he does not use drugs.  No Known Allergies  Family History  Problem Relation Age of Onset   Cancer Sister        per patient of the cancer of tongue    Prior to Admission medications   Medication Sig Start Date End Date Taking? Authorizing Provider  amoxicillin  (AMOXIL ) 500 MG capsule Take 1 capsule (500 mg total) by mouth 3 (three) times daily. 05/27/23   Sofia, Leslie K, PA-C  ipratropium (ATROVENT ) 0.03 % nasal spray Place 2 sprays into both nostrils 2 (two) times daily. 09/17/16   McVeyAlmarie Folks, PA-C    Physical Exam: Vitals:   10/14/24 1445 10/14/24 1500 10/14/24 1515 10/14/24 1530  BP: (!) 157/86 (!) 164/85 (!) 173/91 (!) 160/81  Pulse: 65 65 66   Resp: 15 14 17    Temp:      TempSrc:      SpO2: 100% 100% 100%   Weight:      Height:       Gen Exam:Alert awake-not in any distress HEENT:atraumatic, normocephalic Chest: B/L clear to auscultation anteriorly CVS:S1S2 regular Abdomen:soft non tender, non distended Extremities:no edema Neurology: Non focal, appears to have very subtle  left facial droop-visual field cut in mostly left upper field-and some in the left lower field. Skin: no rash  Data Reviewed:     Latest Ref Rng & Units 10/14/2024    1:55 PM 10/14/2024    1:51 PM 09/17/2016   10:19 AM  CBC  WBC 4.0 - 10.5 K/uL  8.0  6.1   Hemoglobin 13.0 - 17.0 g/dL 84.9  84.9  83.8   Hematocrit 39.0 - 52.0 % 44.0  45.2  45.8   Platelets 150 - 400 K/uL  180         Latest Ref Rng & Units 10/14/2024    1:55 PM 10/14/2024    1:51 PM 07/25/2016    9:20 AM  BMP  Glucose 70 - 99 mg/dL  93  94  87   BUN 8 - 23 mg/dL 25  22  24    Creatinine 0.61 - 1.24 mg/dL 8.29  8.36  8.73   Sodium 135 - 145 mmol/L 142  139  139   Potassium 3.5 - 5.1 mmol/L 4.8  4.6  5.2   Chloride 98 - 111 mmol/L 107  108  112   CO2 22 - 32 mmol/L  22  24   Calcium 8.9 - 10.3 mg/dL  8.5  9.2      Twelve-lead EKG: Normal sinus rhythm  Assessment and Plan: 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  CKD stage III A Appears to have some mild renal insufficiency at baseline Monitor creatinine trend (got contrast for CTA)  Advance Care Planning:   Code Status: Full Code   Consults: Neuro  Family Communication: Spouse  Severity of Illness: The appropriate patient status for this patient is INPATIENT. Inpatient status is judged to be reasonable and necessary in order to provide the required intensity of service to ensure the patient's safety. The patient's presenting symptoms, physical exam findings, and initial radiographic and laboratory data in the context of their chronic comorbidities is felt to place them at high risk for further clinical deterioration. Furthermore, it is not anticipated that the patient will be medically stable for discharge from the hospital within 2 midnights of admission.   * I certify that at the point of admission it is my clinical judgment that the patient will require inpatient hospital care spanning beyond 2 midnights from the point of admission due to high intensity of service, high risk for further deterioration and high frequency of surveillance required.*  Author: Donalda Applebaum, MD 10/14/2024 3:38 PM  For on call review [email or web address].

## 2024-10-14 NOTE — Code Documentation (Signed)
 Stroke Response Nurse Documentation Code Documentation  Joshua Golden is a 78 y.o. male arriving to [facility]  via [facility] on 10/14/2024 with past medical hx of unknown. On No antithrombotic. Code stroke was activated by EMS.   Patient from Urgent care where he was LKW at 1300 yesterday and now complaining of left neglect, vision loss, and left droop.  Stroke team at the bedside on patient arrival. Labs drawn and patient cleared for CT by Dr. Freddi. Patient to CT with team. NIHSS 5, see documentation for details and code stroke times. Patient with right gaze preference , left hemianopia, left facial droop, and Sensory  neglect on exam. The following imaging was completed:  CT Head, CTA, and CTP. Patient is not a candidate for IV Thrombolytic due to last known well outside treatment window.. Patient is not a candidate for IR due to LKW outside treatment window., no LVO on advanced imaging.   Care Plan:   No acute treatment/TIA alert: q2h x 12 hours NIHSS & VS, then q4h   Bedside handoff with ED RN Camie.    Joshua Golden  Stroke Response RN

## 2024-10-14 NOTE — ED Provider Notes (Signed)
 [place] EMERGENCY DEPARTMENT AT [facility] Provider Note   CSN: [id number] Arrival date & time: 10/14/24  1350     Patient presents with: Code Stroke   Joshua Golden is a 78 y.o. male.   HPI 78 year old male presents as a code stroke.  History is primarily from EMS at this time.  The patient reportedly had a last known well around 3 PM yesterday.  The report is that the wife noticed at 3 PM yesterday that he was having trouble pumping gas which is very abnormal for him.  He has seemed to have some left-sided visual deficits and left-sided numbness as well.  EMS reports significant hypertension.  Prior to Admission medications   Medication Sig Start Date End Date Taking? Authorizing Provider  amoxicillin  (AMOXIL ) 500 MG capsule Take 1 capsule (500 mg total) by mouth 3 (three) times daily. 05/27/23   Sofia, Leslie K, PA-C  ipratropium (ATROVENT ) 0.03 % nasal spray Place 2 sprays into both nostrils 2 (two) times daily. 09/17/16   McVey, Almarie Folks, PA-C    Allergies: Patient has no known allergies.    Review of Systems  Unable to perform ROS: Acuity of condition    Updated Vital Signs BP (!) 164/85   Pulse 65   Temp 98.7 F (37.1 C) (Oral)   Resp 14   Ht 5' 8 (1.727 m)   Wt 72.6 kg   SpO2 100%   BMI 24.33 kg/m   Physical Exam Vitals and nursing note reviewed.  Constitutional:      Appearance: He is well-developed.  HENT:     Head: Normocephalic and atraumatic.  Eyes:     Extraocular Movements: Extraocular movements intact.     Comments: Patient appears to have left-sided visual field deficits  Pulmonary:     Effort: Pulmonary effort is normal.  Abdominal:     General: There is no distension.  Skin:    General: Skin is warm and dry.  Neurological:     Mental Status: He is alert.     Comments: Awake, alert.  He has equal strength in all 4 extremities.  Normal heel-to-shin bilaterally.  Some trouble with finger-to-nose on the left, normal on  the right.     (all labs ordered are listed, but only abnormal results are displayed) Labs Reviewed  COMPREHENSIVE METABOLIC PANEL WITH GFR - Abnormal; Notable for the following components:      Result Value   Creatinine, Ser 1.63 (*)    Calcium 8.5 (*)    GFR, Estimated 43 (*)    All other components within normal limits  I-STAT CHEM 8, ED - Abnormal; Notable for the following components:   BUN 25 (*)    Creatinine, Ser 1.70 (*)    All other components within normal limits  PROTIME-INR  APTT  CBC  DIFFERENTIAL  ETHANOL  CBG MONITORING, ED    EKG: EKG Interpretation Date/Time:  Thursday October 14 2024 14:22:06 EST Ventricular Rate:  83 PR Interval:  178 QRS Duration:  101 QT Interval:  379 QTC Calculation: 446 R Axis:   79  Text Interpretation: Sinus rhythm Consider left atrial enlargement Interpretation limited secondary to artifact No old tracing to compare Confirmed by Freddi Hamilton [id number]) on 10/14/2024 2:32:01 PM  Radiology: CT ANGIO HEAD NECK W WO CM W PERF (CODE STROKE) LKW > 6h Result Date: 10/14/2024 EXAM: CTA Head and Neck with Perfusion 10/14/2024 02:08:19 PM TECHNIQUE: CTA of the head and neck was performed without and  with the administration of 75 mL of intravenous contrast (iohexol (OMNIPAQUE) 350 MG/ML injection 75 mL IOHEXOL 350 MG/ML SOLN). 3D postprocessing with multiplanar reconstructions and MIPs was performed to evaluate the vascular anatomy. Cerebral perfusion analysis using computed tomography with contrast administration, including post-processing of parametric maps with determination of cerebral blood flow, cerebral blood volume, mean transit time and time-to-maximum. Automated exposure control, iterative reconstruction, and/or weight based adjustment of the mA/kV was utilized to reduce the radiation dose to as low as reasonably achievable. COMPARISON: None available CLINICAL HISTORY: Neuro deficit, acute, stroke suspected FINDINGS: CTA NECK: AORTIC  ARCH AND ARCH VESSELS: No dissection or arterial injury. No significant stenosis of the brachiocephalic or subclavian arteries. CERVICAL CAROTID ARTERIES: Mild irregularity is present in the proximal ICA bilaterally without significant stenosis. No dissection or arterial injury. No hemodynamically significant stenosis by NASCET criteria. CERVICAL VERTEBRAL ARTERIES: The vertebral arteries are codominant. No dissection, arterial injury, or significant stenosis. LUNGS AND MEDIASTINUM: Unremarkable. SOFT TISSUES: No acute abnormality. BONES: No acute abnormality. CTA HEAD: ANTERIOR CIRCULATION: No significant stenosis of the internal carotid arteries. Mild distal small vessel irregularity is present within the anterior circulation compatible with intracranial atherosclerotic change. No other focal high grade stenosis or occlusion is present. No aneurysm. POSTERIOR CIRCULATION: A fetal type left posterior cerebral artery is present. Mild distal small vessel irregularity is present within the posterior circulation compatible with intracranial atherosclerotic change. No other focal high grade stenosis or occlusion is present. No aneurysm. OTHER: No dural venous sinus thrombosis on this non-dedicated study. CT PERFUSION: EXAM QUALITY: Exam quality is adequate with diagnostic perfusion maps. No significant motion artifact. Appropriate arterial inflow and venous outflow curves. CORE INFARCT (CBF<30% volume): 0 mL TOTAL HYPOPERFUSION (Tmax>6s volume): 35 mL PENUMBRA: Mismatch volume: 35 mL Mismatch ratio: not applicable Location: right parietal and occipital lobes SINUSES: Mild mucosal thickening is present in the maxillary sinuses and ethmoid air cells bilaterally. IMPRESSION: 1. Perfusion mismatch in the right parietal and occipital lobes, volume 35 mL. This corresponds to the area of abnormality on the noncontrast head CT although a core infarct is not demonstrated. Depending on the time frame the core infarct may not be  visible 2. High-grade stenosis in the inferior proximal right M2 segment. 3. Mild distal small vessel irregularity within the anterior and posterior circulation compatible with intracranial atherosclerotic change. 4. No additional focal high-grade stenosis or occlusion. Findings were discussed with Dr. Lidnzen at 2:25 pm Electronically signed by: Lonni Necessary MD 10/14/2024 02:42 PM EST RP Workstation: [id number]   CT HEAD CODE STROKE WO CONTRAST Result Date: 10/14/2024 EXAM: CT HEAD WITHOUT CONTRAST 10/14/2024 01:56:27 PM TECHNIQUE: CT of the head was performed without the administration of intravenous contrast. Automated exposure control, iterative reconstruction, and/or weight based adjustment of the mA/kV was utilized to reduce the radiation dose to as low as reasonably achievable. COMPARISON: MR head without contrast 09/10/2023. CLINICAL HISTORY: Neuro deficit, acute, stroke suspected. FINDINGS: BRAIN AND VENTRICLES: Periventricular and scattered subcortical white matter hypoattenuation demonstrates some interval change. An age indeterminate cortical infarct is present in the posterior right parietal and occipital lobes. No acute hemorrhage. No hydrocephalus. No extra-axial collection. No mass effect or midline shift. [place] Stroke Program Early CT Score (ASPECTS): Ganglionic (caudate, IC, lentiform nucleus, insula, M1-M3): 6 Supraganglionic (M4-M6): 2 Total: 8 ORBITS: No acute abnormality. SINUSES: No acute abnormality. SOFT TISSUES AND SKULL: No acute soft tissue abnormality. No skull fracture. IMPRESSION: 1. Age-indeterminate cortical infarct involving the posterior right parietal and occipital lobes is  likely acute/subacute . 2. Periventricular and scattered subcortical white matter hypoattenuation with interval change. 3. ASPECTS: 8. 4. These results were communicated to Dr. Lindzen at 2:06 PM on 10/14/2024 by secure text page via the [facility] messaging system. Electronically signed by: Lonni Necessary MD 10/14/2024 02:07 PM EST RP Workstation: [id number]     .Critical Care  Performed by: Freddi Hamilton, MD Authorized by: Freddi Hamilton, MD   Critical care provider statement:    Critical care time (minutes):  30   Critical care time was exclusive of:  Separately billable procedures and treating other patients   Critical care was necessary to treat or prevent imminent or life-threatening deterioration of the following conditions:  CNS failure or compromise   Critical care was time spent personally by me on the following activities:  Development of treatment plan with patient or surrogate, discussions with consultants, evaluation of patient's response to treatment, examination of patient, ordering and review of laboratory studies, ordering and review of radiographic studies, ordering and performing treatments and interventions, pulse oximetry, re-evaluation of patient's condition and review of old charts    Medications Ordered in the ED  clevidipine (CLEVIPREX) infusion 0.5 mg/mL (0 mg/hr Intravenous Hold 10/14/24 1452)  sodium chloride  0.9 % bolus 500 mL (has no administration in time range)  sodium chloride  flush (NS) 0.9 % injection 3 mL (3 mLs Intravenous Given 10/14/24 1429)  iohexol (OMNIPAQUE) 350 MG/ML injection 75 mL (75 mLs Intravenous Contrast Given 10/14/24 1409)                                    Medical Decision Making Amount and/or Complexity of Data Reviewed Labs: ordered.    Details: Mild AKI Radiology: ordered and independent interpretation performed.    Details: No head bleed ECG/medicine tests: ordered and independent interpretation performed.    Details: No arrhythmia  Risk Prescription drug management. Decision regarding hospitalization.   Patient presents as a code stroke.  However it seems like he was probably outside of the window, even the LVO window with further discussion with wife.  Either way, no obvious LVO on the CTA.  Neuro had originally  ordered some Cleviprex but his blood pressure has improved substantially without treatment, this will be held.  He will need hospitalist admission for stroke workup and care.  Discussed with Dr. Raenelle for admission.     Final diagnoses:  Acute ischemic stroke [facility])    ED Discharge Orders     None          Freddi Hamilton, MD 10/14/24 [phone]

## 2024-10-14 NOTE — ED Notes (Signed)
 CCMD called.

## 2024-10-14 NOTE — ED Notes (Signed)
 Per neurologist, BP goal SBP 170-180. Cleviprex not initiated att for BP in range.

## 2024-10-14 NOTE — Consult Note (Incomplete)
 NEUROLOGY CONSULT NOTE   Date of service: October 14, 2024 Patient Name: Joshua Golden MRN:  [id number] DOB:  [birth year] Chief Complaint: New onset of left facial droop, left sided vision loss and left sided numbness.  Requesting Provider: Freddi Hamilton, MD  History of Present Illness  Joshua Golden is a 78 y.o. male with a PMHx of arthritis and prostatic hypertrophy with urinary retention who presents to the ED via EMS as a Code Stroke for left facial droop, left sided vision loss and left sided numbness. He was LKN at 0830 yesterday when his wife saw him using the stove while preparing breakfast. His wife then left the home and when she returned he seemed quieter than usual. She drove with him as a passenger to a gas station where she then noted that he was having trouble coordinating his movements to pump gas. She wanted him to be evaluated, but he refused. On waking this AM, he still seemed off. They went for a walk and he ran into a post that was in plain sight. He then decided to be evaluated at Urgent Care. From there EMS was called and he was brought to the [facility] ED.   LKW: 8:30 AM on Wednesday Modified rankin score: 0 IV Thrombolysis/EVT: No: Out of the TNK and VIR time windows   NIHSS components Score: Comment  1a Level of Conscious 0[x]  1[]  2[]  3[]      1b LOC Questions 0[x]  1[]  2[]       1c LOC Commands 0[x]  1[]  2[]       2 Best Gaze 0[]  1[x]  2[]      Right gaze preference with hesitancy on crossing midline and gazing to left.   3 Visual 0[]  1[]  2[x]  3[]     Left homonymous hemianopsia  4 Facial Palsy 0[]  1[x]  2[]  3[]      5a Motor Arm - left 0[x]  1[]  2[]  3[]  4[]  UN[]    5b Motor Arm - Right 0[x]  1[]  2[]  3[]  4[]  UN[]    6a Motor Leg - Left 0[x]  1[]  2[]  3[]  4[]  UN[]    6b Motor Leg - Right 0[x]  1[]  2[]  3[]  4[]  UN[]    7 Limb Ataxia 0[]  1[x]  2[]  UN[]     Left optic ataxia   8 Sensory 0[x]  1[]  2[]  UN[]      9 Best Language 0[x]  1[]  2[]  3[]      10 Dysarthria 0[x]  1[]  2[]  UN[]      11  Extinct. and Inattention 0[]  1[x]  2[]      Extinction to left face, arm and leg with DSS  TOTAL:   6      ROS  Comprehensive ROS performed and pertinent positives documented in HPI    Past History   Past Medical History:  Diagnosis Date   Allergy    Arthritis    arthritis- fingers   Hypertrophy of prostate with urinary retention    in and out self catheterization x3 daily at present   Thrombocytopenia    hx of in third grade no problems now    UTI (urinary tract infection)    07-25-16 being tx now    Past Surgical History:  Procedure Laterality Date   CYSTOSCOPY     x1 in office.   HERNIA REPAIR     left inguinal hernia repair   PROSTATE BIOPSY N/A 07/30/2016   Procedure: BIOPSY TRANSRECTAL ULTRASONIC PROSTATE (TUBP);  Surgeon: Norleen Seltzer, MD;  Location: [facility];  Service: Urology;  Laterality: N/A;   SHOULDER OPEN ROTATOR CUFF REPAIR  02/05/2012  Procedure: ROTATOR CUFF REPAIR SHOULDER OPEN;  Surgeon: Tanda DELENA Heading, MD;  Location: [facility];  Service: Orthopedics;  Laterality: Right;   TONSILLECTOMY     TRANSURETHRAL RESECTION OF PROSTATE N/A 07/30/2016   Procedure: TRANSURETHRAL RESECTION OF THE PROSTATE (TURP);  Surgeon: Norleen Seltzer, MD;  Location: [facility];  Service: Urology;  Laterality: N/A;   VASECTOMY      Family History: Family History  Problem Relation Age of Onset   Cancer Sister        per patient of the cancer of tongue    Social History  reports that he quit smoking about 51 years ago. His smoking use included cigarettes. He started smoking about 57 years ago. He has never used smokeless tobacco. He reports current alcohol use. He reports that he does not use drugs.  No Known Allergies  Medications   Current Facility-Administered Medications:    clevidipine (CLEVIPREX) infusion 0.5 mg/mL, 0-21 mg/hr, Intravenous, Continuous, Mamta Rimmer, MD   sodium chloride  flush (NS) 0.9 % injection 3 mL, 3 mL, Intravenous, Once, Freddi Hamilton, MD  Current  Outpatient Medications:    amoxicillin  (AMOXIL ) 500 MG capsule, Take 1 capsule (500 mg total) by mouth 3 (three) times daily., Disp: 30 capsule, Rfl: 0   ipratropium (ATROVENT ) 0.03 % nasal spray, Place 2 sprays into both nostrils 2 (two) times daily., Disp: 30 mL, Rfl: 0  Vitals   Vitals:   October 25, 2024 1300  Weight: 73.4 kg    Body mass index is 24.25 kg/m.   Physical Exam   Constitutional: Appears well-developed and well-nourished.  Psych: Affect appropriate to situation.  Eyes: No scleral injection.  HENT: No OP obstruction.  Head: Normocephalic.  Respiratory: Effort normal, non-labored breathing.  Skin: WDI.   Neurologic Examination   See NIHSS  Labs/Imaging/Neurodiagnostic studies   CBC:  Recent Labs  Lab 10-25-24 1351 October 25, 2024 1355  WBC 8.0  --   NEUTROABS 5.1  --   HGB 15.0 15.0  HCT 45.2 44.0  MCV 90.0  --   PLT 180  --    Basic Metabolic Panel:  Lab Results  Component Value Date   NA 142 2024-10-25   K 4.8 10/25/24   CO2 24 07/25/2016   GLUCOSE 93 10/25/2024   BUN 25 (H) 10/25/2024   CREATININE 1.70 (H) 10-25-24   CALCIUM 9.2 07/25/2016   GFRNONAA 57 (L) 07/25/2016   GFRAA >60 07/25/2016   Lipid Panel: No results found for: LDLCALC HgbA1c: No results found for: HGBA1C Urine Drug Screen: No results found for: LABOPIA, COCAINSCRNUR, LABBENZ, AMPHETMU, THCU, LABBARB  Alcohol Level No results found for: [facility] INR  Lab Results  Component Value Date   INR 0.91 02/03/2012   APTT  Lab Results  Component Value Date   APTT 35 02/03/2012     ASSESSMENT  Joshua Golden is a 78 y.o. male with a PMHx of arthritis and prostatic hypertrophy with urinary retention who presents to the ED via EMS as a Code Stroke for left facial droop, left sided vision loss and left sided numbness. He was LKN at 0830 yesterday when his wife saw him using the stove while preparing breakfast. His wife then left the home and when she returned he seemed  quieter than usual. She drove with him as a passenger to a gas station where she then noted that he was having trouble coordinating his movements to pump gas. She wanted him to be evaluated, but he refused. On waking this AM, he still  seemed off. They went for a walk and he ran into a post that was in plain sight. He then decided to be evaluated at Urgent Care. From there EMS was called and he was brought to the [facility] ED.  - Exam reveals findings best referable to the right parietooccipital region.  - CT head: Age-indeterminate cortical infarct involving the posterior right parietal and occipital lobes is likely acute/subacute .Periventricular and scattered subcortical white matter hypoattenuation with interval change. ASPECTS: 8. - CTA of head and neck with CTP: Perfusion mismatch in the right parietal and occipital lobes, volume 35 mL. This corresponds to the area of abnormality on the noncontrast head CT although a core infarct is not demonstrated. Depending on the time frame the core infarct may not be visible. High-grade stenosis in the inferior proximal right M2 segment. Mild distal small vessel irregularity within the anterior and posterior circulation compatible with intracranial atherosclerotic change. No additional focal high-grade stenosis or occlusion. - Labs: BUN 22, Cr 1.63, eGFR 43. CBC normal. Coags normal. Glucose 93.  - Impression:  - Early subacute right parietooccipital ischemic stroke. DDx for underlying etiology includes intracranial atherosclerotic disease and cardioembolic.  - Out of the TNK and mechanical thrombectomy time windows.   RECOMMENDATIONS  1. HgbA1c, fasting lipid panel 2. MRI of the brain without contrast 3. PT consult, OT consult, Speech consult 4. Echocardiogram 5. Statin 6. Start DAPT with ASA and Plavix.   7. Risk factor modification 8. Telemetry monitoring 9. Frequent neuro checks 10 NPO until passes stroke swallow screen 11. BP management. Out of the  permissive HTN time window.  12. Discussed with Dr. Freddi.  13. Stroke Team to follow in the morning.    ______________________________________________________________________    Bonney SHARK, Arrion Burruel, MD [facility]

## 2024-10-14 NOTE — ED Triage Notes (Signed)
 Pt. BIB [facility] from UC with c/o Stroke-like sympoms; LKW was 8030 yesterday; wife stated she started noticing that pt. Was having difficulties doing normal tasks, then later in the day, while he was pumping gas, the wife noticed that he was having some difficulties with that as well; Wife also noticed a facial droop on L side; Pt. Reports decreased arm sensation on L side and complete vision lost on L side. Pt. Has 2 18G on L FA and R FA.   BP: 215/94

## 2024-10-15 ENCOUNTER — Inpatient Hospital Stay (HOSPITAL_COMMUNITY)

## 2024-10-15 DIAGNOSIS — R29704 NIHSS score 4: Secondary | ICD-10-CM | POA: Diagnosis not present

## 2024-10-15 DIAGNOSIS — Z87891 Personal history of nicotine dependence: Secondary | ICD-10-CM

## 2024-10-15 DIAGNOSIS — E785 Hyperlipidemia, unspecified: Secondary | ICD-10-CM | POA: Diagnosis not present

## 2024-10-15 DIAGNOSIS — I6389 Other cerebral infarction: Secondary | ICD-10-CM | POA: Diagnosis not present

## 2024-10-15 DIAGNOSIS — I6340 Cerebral infarction due to embolism of unspecified cerebral artery: Secondary | ICD-10-CM | POA: Diagnosis not present

## 2024-10-15 DIAGNOSIS — I639 Cerebral infarction, unspecified: Secondary | ICD-10-CM | POA: Diagnosis not present

## 2024-10-15 DIAGNOSIS — I7090 Unspecified atherosclerosis: Secondary | ICD-10-CM | POA: Diagnosis not present

## 2024-10-15 LAB — ECHOCARDIOGRAM COMPLETE
AR max vel: 2.78 cm2
AV Area VTI: 2.5 cm2
AV Area mean vel: 2.44 cm2
AV Mean grad: 3 mmHg
AV Peak grad: 7 mmHg
Ao pk vel: 1.32 m/s
Area-P 1/2: 3.68 cm2
Calc EF: 63.4 %
Height: 68 in
MV VTI: 2.96 cm2
S' Lateral: 2.6 cm
Single Plane A2C EF: 66.3 %
Single Plane A4C EF: 61.6 %
Weight: 2603.19 [oz_av]

## 2024-10-15 LAB — BASIC METABOLIC PANEL WITH GFR
Anion gap: 8 (ref 5–15)
BUN: 19 mg/dL (ref 8–23)
CO2: 22 mmol/L (ref 22–32)
Calcium: 8.4 mg/dL — ABNORMAL LOW (ref 8.9–10.3)
Chloride: 108 mmol/L (ref 98–111)
Creatinine, Ser: 1.51 mg/dL — ABNORMAL HIGH (ref 0.61–1.24)
GFR, Estimated: 47 mL/min — ABNORMAL LOW (ref >60)
Glucose, Bld: 91 mg/dL (ref 70–99)
Potassium: 4 mmol/L (ref 3.5–5.1)
Sodium: 138 mmol/L (ref 135–145)

## 2024-10-15 LAB — LIPID PANEL
Cholesterol: 200 mg/dL (ref 0–200)
HDL: 42 mg/dL (ref >40)
LDL Cholesterol: 139 mg/dL — ABNORMAL HIGH (ref 0–99)
Total CHOL/HDL Ratio: 4.8 ratio
Triglycerides: 93 mg/dL (ref <150)
VLDL: 19 mg/dL (ref 0–40)

## 2024-10-15 MED ORDER — CLOPIDOGREL BISULFATE 75 MG PO TABS: 75.0000 mg | ORAL_TABLET | Freq: Every day | ORAL | 0 refills | Status: AC

## 2024-10-15 MED ORDER — ASPIRIN 81 MG PO TBEC: 81.0000 mg | DELAYED_RELEASE_TABLET | Freq: Every day | ORAL | 11 refills | Status: AC

## 2024-10-15 MED ORDER — STUDY - LIBREXIA-STROKE - MILVEXIAN 25 MG OR PLACEBO TABLET (PI-SETHI)
1.0000 | ORAL_TABLET | Freq: Two times a day (BID) | ORAL | Status: DC
  Administered 2024-10-15: 1 via ORAL
  Filled 2024-10-15: qty 1

## 2024-10-15 MED ORDER — ATORVASTATIN CALCIUM 40 MG PO TABS: 40.0000 mg | ORAL_TABLET | Freq: Every day | ORAL | 1 refills | Status: AC

## 2024-10-15 MED ORDER — SENNOSIDES-DOCUSATE SODIUM 8.6-50 MG PO TABS: 2.0000 | ORAL_TABLET | Freq: Every evening | ORAL | 0 refills | Status: AC | PRN

## 2024-10-15 MED ORDER — STUDY - LIBREXIA-STROKE - MILVEXIAN 25 MG OR PLACEBO TABLET (PI-SETHI): 1.0000 | ORAL_TABLET | Freq: Two times a day (BID) | ORAL | 0 refills | Status: DC

## 2024-10-15 MED ORDER — ATORVASTATIN CALCIUM 40 MG PO TABS
40.0000 mg | ORAL_TABLET | Freq: Every day | ORAL | Status: DC
  Administered 2024-10-15: 40 mg via ORAL
  Filled 2024-10-15: qty 1

## 2024-10-15 NOTE — Care Management Obs Status (Signed)
 MEDICARE OBSERVATION STATUS NOTIFICATION   Patient Details  Name: Joshua Golden MRN: [id number] Date of Birth: [birth year]   Medicare Observation Status Notification Given:  Yes    [name], RN 10/15/2024, 12:01 PM

## 2024-10-15 NOTE — Progress Notes (Addendum)
 STROKE TEAM PROGRESS NOTE    SIGNIFICANT HOSPITAL EVENTS  L numbness, L vision loss, L decreased coordination  INTERIM HISTORY/SUBJECTIVE Patient developed sudden onset of left-sided vision difficulties and trouble pumping gas into his car at the 30 p.m. on 10/13/2024 as per wife.  He was fine in the morning when he got up and when she went to work and she returned in the afternoon he was still fine walking to his car without any problems.   CT head and MRI scan show acute right parietal occipital infarct.  CT perfusion shows pseudo 0 core and perfusion defect of 35 cubic cc Wife at bedside. Patient sitting up in chair.  He is oriented and able to prvocie clear history about onset of symptoms, agreeing with wife that it seems that it was around 1500 Wednesday. Continues to have mild right gaze preference (able to track and cross midline), L side numbness,L hemianopia--counseled on no driving until he has neurology follow-up an vision has improved.   Recommend aspirin and plavix for 3 months due to high grade intracranial stenosis, then aspirin. Continue lipitor 40mg  on dc.  Discussed Librexia stroke trial--patient and wife reviewing materials.   OBJECTIVE  CBC    Component Value Date/Time   WBC 8.0 10/14/2024 1351   RBC 5.02 10/14/2024 1351   HGB 15.0 10/14/2024 1355   HCT 44.0 10/14/2024 1355   PLT 180 10/14/2024 1351   MCV 90.0 10/14/2024 1351   MCV 86.3 09/17/2016 1019   MCH 29.9 10/14/2024 1351   MCHC 33.2 10/14/2024 1351   RDW 12.4 10/14/2024 1351   LYMPHSABS 2.2 10/14/2024 1351   MONOABS 0.5 10/14/2024 1351   EOSABS 0.2 10/14/2024 1351   BASOSABS 0.0 10/14/2024 1351    BMET    Component Value Date/Time   NA 138 10/15/2024 0137   K 4.0 10/15/2024 0137   CL 108 10/15/2024 0137   CO2 22 10/15/2024 0137   GLUCOSE 91 10/15/2024 0137   BUN 19 10/15/2024 0137   CREATININE 1.51 (H) 10/15/2024 0137   CREATININE 1.46 (H) 05/20/2016 0928   CALCIUM 8.4 (L) 10/15/2024 0137    GFRNONAA 47 (L) 10/15/2024 0137   GFRNONAA 49 (L) 05/20/2016 0928    IMAGING past 24 hours MR BRAIN WO CONTRAST Result Date: 10/14/2024 EXAM: MRI BRAIN WITHOUT CONTRAST 10/14/2024 05:47:12 PM TECHNIQUE: Multiplanar multisequence MRI of the head/brain was performed without the administration of intravenous contrast. COMPARISON: CT head without contrast 10/14/2024 and CT angio head and neck 10/14/2024. CLINICAL HISTORY: Neuro deficit, acute, stroke suspected. FINDINGS: BRAIN AND VENTRICLES: Diffusion weighted images confirm an acute/subacute nonhemorrhagic infarct involving the right parietal and occipital lobes. T2 and FLAIR hyperintensity suggest the infarct is at least several hours old. Periventricular and scattered subcortical T2 hyperintensities are otherwise moderately advanced for age. No intracranial hemorrhage. No mass. No midline shift. No hydrocephalus. The sella is unremarkable. Normal flow voids. ORBITS: No acute abnormality. SINUSES AND MASTOIDS: Mild diffuse mucosal thickening is present throughout the maxillary sinuses and ethmoid air cells bilaterally. Right greater than left mastoid effusions are present. No obstructing nasopharyngeal lesion is present. BONES AND SOFT TISSUES: Normal marrow signal. No acute soft tissue abnormality. IMPRESSION: 1. Acute/subacute nonhemorrhagic infarct involving the right parietal and occipital lobes, at least several hours old. 2. Moderately advanced periventricular and scattered subcortical T2 hyperintensities for age. Electronically signed by: Lonni Necessary MD 10/14/2024 06:15 PM EST RP Workstation: [id number]   CT ANGIO HEAD NECK W WO CM W PERF (CODE STROKE) LKW >  6h Result Date: 10/14/2024 EXAM: CTA Head and Neck with Perfusion 10/14/2024 02:08:19 PM TECHNIQUE: CTA of the head and neck was performed without and with the administration of 75 mL of intravenous contrast (iohexol (OMNIPAQUE) 350 MG/ML injection 75 mL IOHEXOL 350 MG/ML SOLN). 3D  postprocessing with multiplanar reconstructions and MIPs was performed to evaluate the vascular anatomy. Cerebral perfusion analysis using computed tomography with contrast administration, including post-processing of parametric maps with determination of cerebral blood flow, cerebral blood volume, mean transit time and time-to-maximum. Automated exposure control, iterative reconstruction, and/or weight based adjustment of the mA/kV was utilized to reduce the radiation dose to as low as reasonably achievable. COMPARISON: None available CLINICAL HISTORY: Neuro deficit, acute, stroke suspected FINDINGS: CTA NECK: AORTIC ARCH AND ARCH VESSELS: No dissection or arterial injury. No significant stenosis of the brachiocephalic or subclavian arteries. CERVICAL CAROTID ARTERIES: Mild irregularity is present in the proximal ICA bilaterally without significant stenosis. No dissection or arterial injury. No hemodynamically significant stenosis by NASCET criteria. CERVICAL VERTEBRAL ARTERIES: The vertebral arteries are codominant. No dissection, arterial injury, or significant stenosis. LUNGS AND MEDIASTINUM: Unremarkable. SOFT TISSUES: No acute abnormality. BONES: No acute abnormality. CTA HEAD: ANTERIOR CIRCULATION: No significant stenosis of the internal carotid arteries. Mild distal small vessel irregularity is present within the anterior circulation compatible with intracranial atherosclerotic change. No other focal high grade stenosis or occlusion is present. No aneurysm. POSTERIOR CIRCULATION: A fetal type left posterior cerebral artery is present. Mild distal small vessel irregularity is present within the posterior circulation compatible with intracranial atherosclerotic change. No other focal high grade stenosis or occlusion is present. No aneurysm. OTHER: No dural venous sinus thrombosis on this non-dedicated study. CT PERFUSION: EXAM QUALITY: Exam quality is adequate with diagnostic perfusion maps. No significant  motion artifact. Appropriate arterial inflow and venous outflow curves. CORE INFARCT (CBF<30% volume): 0 mL TOTAL HYPOPERFUSION (Tmax>6s volume): 35 mL PENUMBRA: Mismatch volume: 35 mL Mismatch ratio: not applicable Location: right parietal and occipital lobes SINUSES: Mild mucosal thickening is present in the maxillary sinuses and ethmoid air cells bilaterally. IMPRESSION: 1. Perfusion mismatch in the right parietal and occipital lobes, volume 35 mL. This corresponds to the area of abnormality on the noncontrast head CT although a core infarct is not demonstrated. Depending on the time frame the core infarct may not be visible 2. High-grade stenosis in the inferior proximal right M2 segment. 3. Mild distal small vessel irregularity within the anterior and posterior circulation compatible with intracranial atherosclerotic change. 4. No additional focal high-grade stenosis or occlusion. Findings were discussed with Dr. Lidnzen at 2:25 pm Electronically signed by: Lonni Necessary MD 10/14/2024 02:42 PM EST RP Workstation: [id number]   CT HEAD CODE STROKE WO CONTRAST Result Date: 10/14/2024 EXAM: CT HEAD WITHOUT CONTRAST 10/14/2024 01:56:27 PM TECHNIQUE: CT of the head was performed without the administration of intravenous contrast. Automated exposure control, iterative reconstruction, and/or weight based adjustment of the mA/kV was utilized to reduce the radiation dose to as low as reasonably achievable. COMPARISON: MR head without contrast 09/10/2023. CLINICAL HISTORY: Neuro deficit, acute, stroke suspected. FINDINGS: BRAIN AND VENTRICLES: Periventricular and scattered subcortical white matter hypoattenuation demonstrates some interval change. An age indeterminate cortical infarct is present in the posterior right parietal and occipital lobes. No acute hemorrhage. No hydrocephalus. No extra-axial collection. No mass effect or midline shift. [place] Stroke Program Early CT Score (ASPECTS): Ganglionic (caudate,  IC, lentiform nucleus, insula, M1-M3): 6 Supraganglionic (M4-M6): 2 Total: 8 ORBITS: No acute abnormality. SINUSES: No acute abnormality.  SOFT TISSUES AND SKULL: No acute soft tissue abnormality. No skull fracture. IMPRESSION: 1. Age-indeterminate cortical infarct involving the posterior right parietal and occipital lobes is likely acute/subacute . 2. Periventricular and scattered subcortical white matter hypoattenuation with interval change. 3. ASPECTS: 8. 4. These results were communicated to Dr. Lindzen at 2:06 PM on 10/14/2024 by secure text page via the [facility] messaging system. Electronically signed by: Lonni Necessary MD 10/14/2024 02:07 PM EST RP Workstation: [id number]    Vitals:   10/15/24 0000 10/15/24 0200 10/15/24 0500 10/15/24 0800  BP:  (!) 165/80 (!) 178/86 (!) 178/90  Pulse:  64 70 73  Resp: 15  19 18   Temp:  98.3 F (36.8 C) 98.3 F (36.8 C) 98.6 F (37 C)  TempSrc:  Oral Oral Oral  SpO2:  97% 100% 99%  Weight:   73.8 kg   Height:        PHYSICAL EXAM General:  Alert, well-nourished, well-developed patient in no acute distress Psych:  Mood and affect appropriate for situation CV: Regular rate and rhythm on monitor Respiratory:  Regular, unlabored respirations on room air GI: Abdomen soft and nontender  NEURO:  Mental Status: AA&Ox3, patient is able to give clear and coherent history Speech/Language: speech is without dysarthria or aphasia.  Naming, repetition, fluency, and comprehension intact.  Cranial Nerves:  II: PERRL. Left dense hemianopia  III, IV, VI: Mild R gaze preference with ability to track and cross midline. Eyelids elevate symmetrically.  V: Decreased sensation to L face VII: Face is symmetrical resting and smiling VIII: hearing intact to voice. IX, X: Palate elevates symmetrically. Phonation is normal.  KP:Dynloizm shrug 5/5. XII: tongue is midline without fasciculations. Motor: 5/5 strength to all muscle groups tested.  Tone: is normal and  bulk is normal Sensation- Decreased to L leg mild Hemi attention on the left Coordination: FTN intact bilaterally, HKS: no ataxia in BLE.No drift.  Gait- deferred  Most Recent NIH: 4    ASSESSMENT/PLAN  Joshua Golden is a 78 y.o. male with arthritis, BPH with urinary retention who presents to the ED via EMS as a Code Stroke for left facial droop, left sided vision loss and left sided numbness. Symptoms seem to have started Wed around 1500 based on conversations with patient and wife. At that time, She wanted him to be evaluated, but he refused. They went for a walk 11/6 and he ran into a post that was in plain sight. He then decided to be evaluated at Urgent Care. From there EMS was called and he was brought to the [facility] ED, admitted for stroke workup after [facility] showed subacute infarct.SABRA NIH on Admission: 6  Acute/Subacute Infarct: Right Parietal and Occipital Etiology: intracranial atherosclerotic disease versus cardioembolic  Code Stroke CT head  Age-indeterminate cortical infarct involving the posterior right parietal and occipital lobes is likely acute/subacute . Periventricular and scattered subcortical white matter hypoattenuation with interval change. ASPECTS: 8. CTA head & neck w/perfusion Perfusion mismatch in the right parietal and occipital lobes, volume 35 mL, corresponds to the area of abnormality on the noncontrast head CT Core infarct is not demonstrated. Depending on the time frame the core infarct may not be visible High-grade stenosis in the inferior proximal right M2 segment. Mild distal small vessel irregularity within the anterior and posterior circulation compatible with intracranial atherosclerotic change. No additional focal high-grade stenosis or occlusion. MRI   Acute/subacute nonhemorrhagic infarct involving the right parietal and occipital lobes, at least several hours old. Moderately advanced  periventricular and scattered subcortical T2 hyperintensities for  age. 2D Echo PENDING Will likely need 30day heart monitor on discharge LDL 139 HgbA1c 5.0 VTE prophylaxis - lovenox No antithrombotic prior to admission, now on aspirin and plavix for 3 months due to high grade intracranial stenosis, then aspirin. Therapy recommendations:  Outpatient PT/OT/ST Disposition:  pending  Hypertension Home meds:  none Stable BP goal: normotensive, as he is outside the permissive hypertension window.    Hyperlipidemia Home meds:  none LDL 139, goal < 70 Add Lipitor 40mg   Continue statin at discharge  Tobacco Abuse Former cigarette smoker   Hospital day # 1   Pt seen by Neuro NP/APP with MD. Note/plan to be edited by MD as needed.    Joshua JAYSON Likes, DNP [facility] Please use AMION for contact information & EPIC for messaging.  I have personally obtained history,examined this patient, reviewed notes, independently viewed imaging studies, participated in medical decision making and plan of care.ROS completed by me personally and pertinent positives fully documented  I have made any additions or clarifications directly to the above note. Agree with note above.  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  Recommend dual antiplatelet therapy aspirin Plavix for 3 months followed by aspirin alone and aggressive risk factor modification.  Continue ongoing stroke workup.  Aggressive risk factor modification.  Physical Occupational Therapy consult.  Patient advised not to drive till peripheral vision loss improves Patient may also consider possible participation in Librexia stroke prevention study (standard of care antiplatelet therapy with and withoutMilvexian-factor XI inhibitor).  Patient and wife are given written information of the study to review and decide.  It was made quite clear that study  participation is voluntary and patient can withdraw consent at any point if not fully satisfied.  No study specific procedure was done before patient signed informed consent   I personally spent a total of 50 minutes in the care of the patient today including getting/reviewing separately obtained history, performing a medically appropriate exam/evaluation, counseling and educating, placing orders, referring and communicating with other health care professionals, documenting clinical information in the EHR, independently interpreting results, and coordinating care.        Eather Popp, MD Medical Director [facility] Stroke Center Pager: [phone] 10/15/2024 1:07 PM   To contact Stroke Continuity provider, please refer to [email or web address]. After hours, contact General Neurology

## 2024-10-15 NOTE — Care Management CC44 (Signed)
 Condition Code 44 Documentation Completed  Patient Details  Name: Joshua Golden MRN: [id number] Date of Birth: 20-Aug-1946   Condition Code 44 given:  Yes Patient signature on Condition Code 44 notice:  Yes Documentation of 2 MD's agreement:  Yes Code 44 added to claim:  Yes    [name], RN 10/15/2024, 12:02 PM

## 2024-10-15 NOTE — TOC CAGE-AID Note (Signed)
 Transition of Care [facility]) - CAGE-AID Screening   Patient Details  Name: Joshua Golden MRN: [id number] Date of Birth: 1946/01/16  [facility] CM/SW Contact:    [name], RN Phone Number: 10/15/2024, 12:02 PM   Clinical Narrative:  Patient refused inpatient and outpatient alcohol counseling resources.  CAGE-AID Screening:

## 2024-10-15 NOTE — Evaluation (Signed)
 Occupational Therapy Evaluation Patient Details Name: Joshua Golden MRN: [id number] DOB: 04/01/1946 Today's Date: 10/15/2024   History of Present Illness   78 y.o. male presents to [facility] 10/14/24 with incoordination and visual field deficits. CT suggestive of R parietal and occipital stroke. No significant pmhx     Clinical Impressions Pt received in chair, supportive wife at bedside. Agreeable for OT evaluation. PTA, pt was indep with ADLs, IADLs, and mobility, + driving. Wears glasses with a corrective prism  (suspected in L lens). Wife reporting pt had a history of diplopia ~6 years ago and updated glasses have corrected his vision. 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 Peripheral vision impaired - pt unable to detect stim past midline, although binocular EOM intact. Functionally, pt was no more than close supervision for all mobility and UB/LB ADLs. Presents with delayed processing during conversation & task execution.   Pt is currently functioning below baseline and would benefit from ongoing acute OT services to progress towards safe discharge and to facilitate return to prior level of function. Current recommendation is home with outpatient OT with neuro rehab focus.     If plan is discharge home, recommend the following:   Assistance with cooking/housework;Direct supervision/assist for medications management;Direct supervision/assist for financial management;Assist for transportation;Supervision due to cognitive status     Functional Status Assessment   Patient has had a recent decline in their functional status and demonstrates the ability to make significant improvements in function in a reasonable and predictable amount of time.     Equipment Recommendations   None recommended by OT     Recommendations for Other Services         Precautions/Restrictions   Precautions Precautions: Fall Recall of  Precautions/Restrictions: Intact Restrictions Weight Bearing Restrictions Per Provider Order: No     Mobility Bed Mobility               General bed mobility comments: not assessed - pt OOB throughout session    Transfers Overall transfer level: Modified independent Equipment used: None                      Balance Overall balance assessment: Needs assistance Sitting-balance support: No upper extremity supported, Feet supported Sitting balance-Leahy Scale: Good Sitting balance - Comments: seated unsupported in recliner chair   Standing balance support: No upper extremity supported, During functional activity Standing balance-Leahy Scale: Fair Standing balance comment: pt with one brief posterior LOB upon approaching chair with uncontrolled descent                           ADL either performed or assessed with clinical judgement   ADL Overall ADL's : Needs assistance/impaired Eating/Feeding: Set up   Grooming: Supervision/safety;Cueing for sequencing;Standing;Wash/dry hands Grooming Details (indicate cue type and reason): cog cues for sequencing and initiation             Lower Body Dressing: Supervision/safety;Sitting/lateral leans Lower Body Dressing Details (indicate cue type and reason): adjusting B socks Toilet Transfer: Supervision/safety;Ambulation;Regular Toilet   Toileting- Clothing Manipulation and Hygiene: Supervision/safety;Cueing for sequencing       Functional mobility during ADLs: Supervision/safety       Vision Baseline Vision/History: 1 Wears glasses (with corrective prism  (suspect in L lens due to thickened lens), wife reporting pt had previous diplopia which was corrected with progressive prism  over time ~6 years ago) Ability to See in  Adequate Light: 0 Adequate Patient Visual Report: Peripheral vision impairment Vision Assessment?: Yes Eye Alignment: Within Functional Limits Ocular Range of Motion: Within  Functional Limits Alignment/Gaze Preference: Within Defined Limits;Gaze right (mild R gaze pref) Tracking/Visual Pursuits: Decreased smoothness of horizontal tracking;Decreased smoothness of eye movement to LEFT superior field;Decreased smoothness of eye movement to LEFT inferior field;Requires cues, head turns, or add eye shifts to track Saccades: Within functional limits Visual Fields: Left homonymous hemianopsia Depth Perception: Overshoots Additional Comments: pt with impaired processing/comprehension of visual testing     Perception Perception: Impaired Preception Impairment Details: Inattention/Neglect Perception-Other Comments: mild L inattention   Praxis Praxis: WFL       Pertinent Vitals/Pain Pain Assessment Pain Assessment: No/denies pain     Extremity/Trunk Assessment Upper Extremity Assessment Upper Extremity Assessment: Overall WFL for tasks assessed   Lower Extremity Assessment Lower Extremity Assessment: Overall WFL for tasks assessed   Cervical / Trunk Assessment Cervical / Trunk Assessment: Normal   Communication Communication Communication: Impaired Factors Affecting Communication:  (delayed processing/responses)   Cognition Arousal: Alert Behavior During Therapy: WFL for tasks assessed/performed Cognition: Cognition impaired     Awareness: Online awareness impaired Memory impairment (select all impairments): Short-term memory Attention impairment (select first level of impairment): Selective attention Executive functioning impairment (select all impairments): Sequencing, Problem solving OT - Cognition Comments: delayed processing during conversation & task execution, reduced insight, anticipate difficulty with more complex higher level executive function tasks                 Following commands: Impaired Following commands impaired: Follows one step commands with increased time     Cueing  General Comments   Cueing Techniques: Verbal  cues;Tactile cues;Visual cues  supportive wife present   Exercises     Shoulder Instructions      Home Living Family/patient expects to be discharged to:: Private residence Living Arrangements: Spouse/significant other Available Help at Discharge: Family;Available 24 hours/day Type of Home: Other(Comment) ([place]) Home Access: Stairs to enter [employer] of Steps: 3 Entrance Stairs-Rails: Left Home Layout: One level     Bathroom Shower/Tub: Chief Strategy Officer: Standard     Home Equipment:  (walking stick)          Prior Functioning/Environment Prior Level of Function : Independent/Modified Independent;Driving             Mobility Comments: Ind with no AD ADLs Comments: indep, wife manages meds & finances    OT Problem List: Impaired balance (sitting and/or standing);Impaired vision/perception;Decreased cognition   OT Treatment/Interventions: Self-care/ADL training;Neuromuscular education;Visual/perceptual remediation/compensation;Balance training      OT Goals(Current goals can be found in the care plan section)   Acute Rehab OT Goals Patient Stated Goal: go home OT Goal Formulation: With patient/family Time For Goal Achievement: 10/29/24 Potential to Achieve Goals: Good   OT Frequency:  Min 2X/week    Co-evaluation              AM-PAC OT 6 Clicks Daily Activity     Outcome Measure Help from another person eating meals?: None Help from another person taking care of personal grooming?: A Little Help from another person toileting, which includes using toliet, bedpan, or urinal?: A Little Help from another person bathing (including washing, rinsing, drying)?: A Little Help from another person to put on and taking off regular upper body clothing?: None Help from another person to put on and taking off regular lower body clothing?: None 6 Click Score: 21  End of Session Nurse Communication: Mobility  status  Activity Tolerance: Patient tolerated treatment well Patient left: in chair;with call bell/phone within reach;with chair alarm set;with family/visitor present;with nursing/sitter in room  OT Visit Diagnosis: Unsteadiness on feet (R26.81);Low vision, both eyes (H54.2)                Time: 9059-8985 OT Time Calculation (min): 34 min Charges:  OT General Charges $OT Visit: 1 Visit OT Evaluation $OT Eval Low Complexity: 1 Low OT Treatments $Therapeutic Activity: 8-22 mins  Marylu Dudenhoeffer D., MSOT, OTR/L Acute Rehabilitation Services [phone] Secure Chat Preferred  Rikki Milch 10/15/2024, 12:57 PM

## 2024-10-15 NOTE — Plan of Care (Signed)
  Problem: Ischemic Stroke/TIA Tissue Perfusion: Goal: Complications of ischemic stroke/TIA will be minimized Outcome: Progressing   Problem: Coping: Goal: Will verbalize positive feelings about self Outcome: Progressing   Problem: Self-Care: Goal: Ability to communicate needs accurately will improve Outcome: Progressing

## 2024-10-15 NOTE — Discharge Summary (Signed)
 Physician Discharge Summary  Patient ID: Joshua Golden MRN: [id number] DOB/AGE: 10-Jul-1946 78 y.o.  Admit date: 10/14/2024 Discharge date: 10/15/2024  Admission Diagnoses:  Discharge Diagnoses:  Principal Problem:   Acute CVA (cerebrovascular accident) [facility])   Discharged Condition: stable  Hospital Course: Patient is a 78 year old male with past medical history significant for arthritis and BPH with urinary retention.  Patient presented to the ED via EMS as a Code Stroke for left facial droop, left sided vision loss and left sided numbness. Symptoms seem to have started Wed around 1500 based on conversations with patient and wife. At that time, She wanted him to be evaluated, but he refused. They went for a walk 11/6 and he ran into a post that was in plain sight. He then decided to be evaluated at Urgent Care. From there EMS was called and he was brought to the [facility] ED, admitted for stroke workup after [facility] showed subacute infarct.SABRA NIH on Admission: 6.  Neurology team directed stroke management.  Patient has been cleared for discharge by the neurology team.  Patient and patient's wife also insisted on being discharged back home today.  Acute/Subacute Infarct: Right Parietal and Occipital Etiology: intracranial atherosclerotic disease versus cardioembolic  Code Stroke CT head  Age-indeterminate cortical infarct involving the posterior right parietal and occipital lobes is likely acute/subacute . Periventricular and scattered subcortical white matter hypoattenuation with interval change. ASPECTS: 8. CTA head & neck w/perfusion Perfusion mismatch in the right parietal and occipital lobes, volume 35 mL, corresponds to the area of abnormality on the noncontrast head CT Core infarct is not demonstrated. Depending on the time frame the core infarct may not be visible High-grade stenosis in the inferior proximal right M2 segment. Mild distal small vessel irregularity within the anterior and  posterior circulation compatible with intracranial atherosclerotic change. No additional focal high-grade stenosis or occlusion. MRI   Acute/subacute nonhemorrhagic infarct involving the right parietal and occipital lobes, at least several hours old. Moderately advanced periventricular and scattered subcortical T2 hyperintensities for age. 2D Echo PENDING Will likely need 30day heart monitor on discharge LDL 139 HgbA1c 5.0 VTE prophylaxis - lovenox No antithrombotic prior to admission, now on aspirin and plavix for 3 months due to high grade intracranial stenosis, then aspirin. Therapy recommendations:  Outpatient PT/OT/ST Disposition:  pending   Hypertension Stable BP goal: normotensive, as he is outside the permissive hypertension window.     Hyperlipidemia LDL 139, goal < 70 Lipitor 40mg     Tobacco Abuse Former cigarette smoker    Consults: neurology  Significant Diagnostic Studies:  MR BRAIN WO CONTRAST Result Date: 10/14/2024 EXAM: MRI BRAIN WITHOUT CONTRAST 10/14/2024 05:47:12 PM TECHNIQUE: Multiplanar multisequence MRI of the head/brain was performed without the administration of intravenous contrast. COMPARISON: CT head without contrast 10/14/2024 and CT angio head and neck 10/14/2024. CLINICAL HISTORY: Neuro deficit, acute, stroke suspected. FINDINGS: BRAIN AND VENTRICLES: Diffusion weighted images confirm an acute/subacute nonhemorrhagic infarct involving the right parietal and occipital lobes. T2 and FLAIR hyperintensity suggest the infarct is at least several hours old. Periventricular and scattered subcortical T2 hyperintensities are otherwise moderately advanced for age. No intracranial hemorrhage. No mass. No midline shift. No hydrocephalus. The sella is unremarkable. Normal flow voids. ORBITS: No acute abnormality. SINUSES AND MASTOIDS: Mild diffuse mucosal thickening is present throughout the maxillary sinuses and ethmoid air cells bilaterally. Right greater than left  mastoid effusions are present. No obstructing nasopharyngeal lesion is present. BONES AND SOFT TISSUES: Normal marrow signal. No acute soft tissue  abnormality. IMPRESSION: 1. Acute/subacute nonhemorrhagic infarct involving the right parietal and occipital lobes, at least several hours old. 2. Moderately advanced periventricular and scattered subcortical T2 hyperintensities for age. Electronically signed by: Lonni Necessary MD 10/14/2024 06:15 PM EST RP Workstation: [id number]    CT ANGIO HEAD NECK W WO CM W PERF (CODE STROKE) LKW > 6h Result Date: 10/14/2024 EXAM: CTA Head and Neck with Perfusion 10/14/2024 02:08:19 PM TECHNIQUE: CTA of the head and neck was performed without and with the administration of 75 mL of intravenous contrast (iohexol (OMNIPAQUE) 350 MG/ML injection 75 mL IOHEXOL 350 MG/ML SOLN). 3D postprocessing with multiplanar reconstructions and MIPs was performed to evaluate the vascular anatomy. Cerebral perfusion analysis using computed tomography with contrast administration, including post-processing of parametric maps with determination of cerebral blood flow, cerebral blood volume, mean transit time and time-to-maximum. Automated exposure control, iterative reconstruction, and/or weight based adjustment of the mA/kV was utilized to reduce the radiation dose to as low as reasonably achievable. COMPARISON: None available CLINICAL HISTORY: Neuro deficit, acute, stroke suspected FINDINGS: CTA NECK: AORTIC ARCH AND ARCH VESSELS: No dissection or arterial injury. No significant stenosis of the brachiocephalic or subclavian arteries. CERVICAL CAROTID ARTERIES: Mild irregularity is present in the proximal ICA bilaterally without significant stenosis. No dissection or arterial injury. No hemodynamically significant stenosis by NASCET criteria. CERVICAL VERTEBRAL ARTERIES: The vertebral arteries are codominant. No dissection, arterial injury, or significant stenosis. LUNGS AND MEDIASTINUM:  Unremarkable. SOFT TISSUES: No acute abnormality. BONES: No acute abnormality. CTA HEAD: ANTERIOR CIRCULATION: No significant stenosis of the internal carotid arteries. Mild distal small vessel irregularity is present within the anterior circulation compatible with intracranial atherosclerotic change. No other focal high grade stenosis or occlusion is present. No aneurysm. POSTERIOR CIRCULATION: A fetal type left posterior cerebral artery is present. Mild distal small vessel irregularity is present within the posterior circulation compatible with intracranial atherosclerotic change. No other focal high grade stenosis or occlusion is present. No aneurysm. OTHER: No dural venous sinus thrombosis on this non-dedicated study. CT PERFUSION: EXAM QUALITY: Exam quality is adequate with diagnostic perfusion maps. No significant motion artifact. Appropriate arterial inflow and venous outflow curves. CORE INFARCT (CBF<30% volume): 0 mL TOTAL HYPOPERFUSION (Tmax>6s volume): 35 mL PENUMBRA: Mismatch volume: 35 mL Mismatch ratio: not applicable Location: right parietal and occipital lobes SINUSES: Mild mucosal thickening is present in the maxillary sinuses and ethmoid air cells bilaterally. IMPRESSION: 1. Perfusion mismatch in the right parietal and occipital lobes, volume 35 mL. This corresponds to the area of abnormality on the noncontrast head CT although a core infarct is not demonstrated. Depending on the time frame the core infarct may not be visible 2. High-grade stenosis in the inferior proximal right M2 segment. 3. Mild distal small vessel irregularity within the anterior and posterior circulation compatible with intracranial atherosclerotic change. 4. No additional focal high-grade stenosis or occlusion. Findings were discussed with Dr. Lidnzen at 2:25 pm Electronically signed by: Lonni Necessary MD 10/14/2024 02:42 PM EST RP Workstation: [id number]    CT HEAD CODE STROKE WO CONTRAST Result Date:  10/14/2024 EXAM: CT HEAD WITHOUT CONTRAST 10/14/2024 01:56:27 PM TECHNIQUE: CT of the head was performed without the administration of intravenous contrast. Automated exposure control, iterative reconstruction, and/or weight based adjustment of the mA/kV was utilized to reduce the radiation dose to as low as reasonably achievable. COMPARISON: MR head without contrast 09/10/2023. CLINICAL HISTORY: Neuro deficit, acute, stroke suspected. FINDINGS: BRAIN AND VENTRICLES: Periventricular and scattered subcortical white matter hypoattenuation demonstrates some interval  change. An age indeterminate cortical infarct is present in the posterior right parietal and occipital lobes. No acute hemorrhage. No hydrocephalus. No extra-axial collection. No mass effect or midline shift. [place] Stroke Program Early CT Score (ASPECTS): Ganglionic (caudate, IC, lentiform nucleus, insula, M1-M3): 6 Supraganglionic (M4-M6): 2 Total: 8 ORBITS: No acute abnormality. SINUSES: No acute abnormality. SOFT TISSUES AND SKULL: No acute soft tissue abnormality. No skull fracture. IMPRESSION: 1. Age-indeterminate cortical infarct involving the posterior right parietal and occipital lobes is likely acute/subacute . 2. Periventricular and scattered subcortical white matter hypoattenuation with interval change. 3. ASPECTS: 8. 4. These results were communicated to Dr. Lindzen at 2:06 PM on 10/14/2024 by secure text page via the [facility] messaging system. Electronically signed by: Lonni Necessary MD 10/14/2024 02:07 PM EST RP Workstation: [id number]    Discharge Exam: Blood pressure (!) 161/81, pulse 70, temperature 98.9 F (37.2 C), temperature source Oral, resp. rate 20, height 5' 8 (1.727 m), weight 73.8 kg, SpO2 100%.   Disposition: Discharge disposition: 06-Home-Health Care Svc       Discharge Instructions     Ambulatory referral to Neurology   Complete by: As directed    An appointment is requested in approximately: 1 week    Ambulatory referral to Occupational Therapy   Complete by: As directed    Ambulatory referral to Physical Therapy   Complete by: As directed    Diet - low sodium heart healthy   Complete by: As directed    Increase activity slowly   Complete by: As directed       Allergies as of 10/15/2024       Reactions   Sweet Potato Nausea And Vomiting        Medication List     STOP taking these medications    ibuprofen 200 MG tablet Commonly known as: ADVIL   VITAMIN C PO       TAKE these medications    aspirin EC 81 MG tablet Take 1 tablet (81 mg total) by mouth daily. Swallow whole. Start taking on: October 16, 2024   atorvastatin 40 MG tablet Commonly known as: LIPITOR Take 1 tablet (40 mg total) by mouth daily. Start taking on: October 16, 2024   clopidogrel 75 MG tablet Commonly known as: PLAVIX Take 1 tablet (75 mg total) by mouth daily. Start taking on: October 16, 2024   LIBREXIA-STROKE milvexian or placebo 25 mg tablet Take 1 tablet by mouth 2 (two) times daily.   senna-docusate 8.6-50 MG tablet Commonly known as: Senokot-S Take 2 tablets by mouth at bedtime as needed for mild constipation.        Follow-up Information     [facility]. Schedule an appointment as soon as possible for a visit.   Specialty: Rehabilitation Contact information: [address] [place]  [address] [phone]        [place] [facility] Follow up.   Why: The office will contact you for the first appointment Contact information: [address] [place], [place] [address]  Phone: [phone]        [facility] Follow up in 1 week(s).                 Time spent: 35 minutes.  SignedBETHA Leatrice LILLETTE Rosario 10/15/2024, 5:43 PM

## 2024-10-15 NOTE — TOC Transition Note (Signed)
 Transition of Care [facility]) - Discharge Note   Patient Details  Name: Joshua Golden MRN: [id number] Date of Birth: Jun 16, 1946  Transition [facility] CM/SW Contact:  [name], RN Phone Number: 10/15/2024, 11:47 AM   Clinical Narrative:     Patient from home with wife, who provides support, will manage medications, and provide transportation for patient. Patient has no DME needs per therapy. Patient set up with outpatient therapy for PT/OT/ST with [facility] rehab. Patient will call to schedule appt.Info on AVS. Patient set up with PCP--[place] at [facility]. Patient will call for appt. Info on AVS.  Final next level of care: OP Rehab Barriers to Discharge: No Barriers Identified   Patient Goals and CMS Choice   CMS Medicare.gov Compare Post Acute Care list provided to:: Patient Choice offered to / list presented to : Patient      Discharge Placement                       Discharge Plan and Services Additional resources added to the After Visit Summary for                                       Social Drivers of Health (SDOH) Interventions SDOH Screenings   Food Insecurity: Low Risk  (08/04/2023)   Received from [facility]  Housing: Low Risk  (08/04/2023)   Received from [facility]  Tobacco Use: Medium Risk (10/14/2024)     Readmission Risk Interventions     No data to display

## 2024-10-15 NOTE — Evaluation (Addendum)
 Physical Therapy Evaluation Patient Details Name: Joshua Golden MRN: [id number] DOB: 1946/04/17 Today's Date: 10/15/2024  History of Present Illness  78 y.o. male presents to [facility] 10/14/24 with incoordination and visual field deficits. CT suggestive of R parietal and occipital stroke. No significant pmhx  Clinical Impression  PTA pt was independent for mobility with no AD. Pt presents with impaired processing/command following, reported visual field loss, and impaired dynamic balance/gait pattern. Pt was able to ambulate 274ft with no AD and supervision. Cueing for obstacle negotiation as pt would run into obstacles on the right and left. When cued to look down, pt was then able to avoid obstacles. Also able to negotiate 2 steps with one handrail and supervision. Pt scored a 16/24 on the DGI indicating pt is at a higher risk of falling. However, deficits in balance were more related to cognition and vision deficits. Recommending neuro OP PT to work on improving mobility and balance. Pt will have 24/7 assist available upon return home. Acute PT to follow to address functional limitations.       If plan is discharge home, recommend the following: A little help with walking and/or transfers;A little help with bathing/dressing/bathroom;Assist for transportation;Help with stairs or ramp for entrance   Can travel by private vehicle    Yes    Equipment Recommendations None recommended by PT     Functional Status Assessment Patient has had a recent decline in their functional status and demonstrates the ability to make significant improvements in function in a reasonable and predictable amount of time.     Precautions / Restrictions Precautions Precautions: Fall Recall of Precautions/Restrictions: Intact Weight Bearing Restrictions Per Provider Order: No      Mobility  Bed Mobility Overal bed mobility: Independent   Transfers Overall transfer level: Modified independent Equipment used:  None   Ambulation/Gait Ambulation/Gait assistance: Supervision Gait Distance (Feet): 250 Feet Assistive device: None Gait Pattern/deviations: Step-through pattern, Decreased stride length Gait velocity: decr     General Gait Details: slowed gait pattern with pt running into obstacles on the right and left with pt attributing this to inferior field visual loss. When cued to look down for obstacles, pt was able to negotiate around them safely  Stairs Stairs: Yes Stairs assistance: Supervision Stair Management: One rail Left, Forwards, Alternating pattern Number of Stairs: 2 General stair comments: step through pattern with 1 UE on handrail per home set-up  Modified Rankin (Stroke Patients Only) Modified Rankin (Stroke Patients Only) Pre-Morbid Rankin Score: No symptoms Modified Rankin: Moderately severe disability     Balance Overall balance assessment: Needs assistance Sitting-balance support: No upper extremity supported, Feet supported Sitting balance-Leahy Scale: Good     Standing balance support: No upper extremity supported Standing balance-Leahy Scale: Good  Standardized Balance Assessment Standardized Balance Assessment : Dynamic Gait Index   Dynamic Gait Index Level Surface: Mild Impairment Change in Gait Speed: Mild Impairment Gait with Horizontal Head Turns: Normal Gait with Vertical Head Turns: Mild Impairment Gait and Pivot Turn: Mild Impairment Step Over Obstacle: Mild Impairment Step Around Obstacles: Mild Impairment Steps: Moderate Impairment Total Score: 16       Pertinent Vitals/Pain Pain Assessment Pain Assessment: No/denies pain    Home Living Family/patient expects to be discharged to:: Private residence Living Arrangements: Spouse/significant other Available Help at Discharge: Family;Available 24 hours/day Type of Home: Other(Comment) ([place]) Home Access: Stairs to enter Entrance Stairs-Rails: Left Entrance Stairs-Number of Steps:  3   Home Layout: One level Home Equipment:  (walking  stick)      Prior Function Prior Level of Function : Independent/Modified Independent    Mobility Comments: Ind with no AD       Extremity/Trunk Assessment   Upper Extremity Assessment Upper Extremity Assessment: Defer to OT evaluation    Lower Extremity Assessment Lower Extremity Assessment: Overall WFL for tasks assessed    Cervical / Trunk Assessment Cervical / Trunk Assessment: Normal  Communication   Communication Communication: Impaired Factors Affecting Communication:  (delayed response)    Cognition Arousal: Alert Behavior During Therapy: WFL for tasks assessed/performed   PT - Cognitive impairments: Awareness, Attention, Initiation, Problem solving, Sequencing, Safety/Judgement    PT - Cognition Comments: Slower processing with inconsistent following of commands. Also has delayed initiation Following commands: Impaired Following commands impaired: Follows one step commands inconsistently, Follows one step commands with increased time     Cueing Cueing Techniques: Verbal cues, Tactile cues     General Comments General comments (skin integrity, edema, etc.): Wife present and supportive     PT Assessment Patient needs continued PT services  PT Problem List Decreased balance;Decreased mobility;Decreased cognition;Decreased safety awareness       PT Treatment Interventions DME instruction;Gait training;Stair training;Therapeutic activities;Functional mobility training;Therapeutic exercise;Balance training;Neuromuscular re-education;Patient/family education    PT Goals (Current goals can be found in the Care Plan section)  Acute Rehab PT Goals Patient Stated Goal: to work on balance PT Goal Formulation: With patient/family Time For Goal Achievement: 10/29/24 Potential to Achieve Goals: Good    Frequency Min 2X/week        AM-PAC PT 6 Clicks Mobility  Outcome Measure Help needed turning from  your back to your side while in a flat bed without using bedrails?: None Help needed moving from lying on your back to sitting on the side of a flat bed without using bedrails?: None Help needed moving to and from a bed to a chair (including a wheelchair)?: A Little Help needed standing up from a chair using your arms (e.g., wheelchair or bedside chair)?: None Help needed to walk in hospital room?: A Little Help needed climbing 3-5 steps with a railing? : A Little 6 Click Score: 21    End of Session Equipment Utilized During Treatment: Gait belt Activity Tolerance: Patient tolerated treatment well Patient left: in chair;with call bell/phone within reach;with chair alarm set;with family/visitor present Nurse Communication: Mobility status PT Visit Diagnosis: Unsteadiness on feet (R26.81);Other abnormalities of gait and mobility (R26.89)    Time: 9247-9183 PT Time Calculation (min) (ACUTE ONLY): 24 min   Charges:   PT Evaluation $PT Eval Low Complexity: 1 Low   PT General Charges $$ ACUTE PT VISIT: 1 Visit        Kate ORN, PT, DPT Secure Chat Preferred  Rehab Office [phone]   Kate BRAVO Wendolyn 10/15/2024, 8:26 AM

## 2024-10-15 NOTE — Progress Notes (Signed)
 Communicated with patient via Caregility. Patient appears comfortable in the bed. AVS paperwork given to the patient. Discharge instructions discussed with patient and spouse. Patient's question answered to satisfaction. Patient agreeable with discharge plan.

## 2024-10-15 NOTE — Progress Notes (Signed)
 LIBREXIA STROKE RESEARCH STUDY NOTE   Patient is participating in Librexia stroke prevention study ( standard of care antiplatelet therapy plus Milvexian-factor 11 inhibitor versus standard of care antiplatelet therapy plus placebo ).  Patient was given study material to review and informed consent form at 09:50 AM.  Patient was advised to take as much time as she wanted to review the consent form and encouraged to ask questions which were answered.  Patient's wife was also present at the bedside and were also encouraged to review the material and ask questions.  It was made clear to the patient that study participation is voluntary and patient will still get the same excellent standard of care irrespective of whether she chooses to participate in the study or not.  The benefits of participation in the study as well as the risk involved including but not limited to bleeding as well as alternatives to not participating in the study were clearly discussed with the patient and family who expressed understanding.  The patient was clearly informed that patient has no obligation to's stay in the study for the entire duration and is free to discontinue study medication or study participation if she is dissatisfied at any time in the future.  The research study office visit scheduled was explained to the patient and study obligations were clearly explained as well.  Patient voiced understanding and willingness to participate in the study.  The study inclusion exclusion criteria reviewed by me personally as well as study coordinator and verified that patient met all criteria.  Patient signed informed consent with study coordinator in my presence and  1 30 pm  .  The hospital research pharmacy was notified in advance about potential patient participation and after patient randomization and patient received first dose of medication before discharge and she was discharged home with the study medication bottle and instructed  to call [facility] research office with any questions.  No study specific procedure was done prior to patient signing informed consent form.  A copy of informed consent form and patient Zell of Rights signed by the patient was given to her.  Eather Popp, MD

## 2024-10-18 ENCOUNTER — Other Ambulatory Visit (HOSPITAL_COMMUNITY): Payer: Self-pay | Admitting: Pharmacist

## 2024-10-18 MED ORDER — STUDY - LIBREXIA-STROKE - MILVEXIAN 25 MG OR PLACEBO TABLET (PI-SETHI): 1.0000 | ORAL_TABLET | Freq: Two times a day (BID) | ORAL | 0 refills | Status: DC

## 2024-11-07 ENCOUNTER — Other Ambulatory Visit: Payer: Self-pay | Admitting: Internal Medicine

## 2024-11-16 ENCOUNTER — Other Ambulatory Visit (HOSPITAL_COMMUNITY): Payer: Self-pay | Admitting: Pharmacist

## 2024-11-16 ENCOUNTER — Other Ambulatory Visit: Payer: Self-pay | Admitting: Neurology

## 2024-11-16 MED ORDER — AMLODIPINE BESYLATE 2.5 MG PO TABS: 2.5000 mg | ORAL_TABLET | Freq: Every day | ORAL | 3 refills | Status: AC

## 2024-11-16 MED ORDER — STUDY - LIBREXIA-STROKE - MILVEXIAN 25 MG OR PLACEBO TABLET (PI-SETHI): 1.0000 | ORAL_TABLET | Freq: Two times a day (BID) | ORAL | 0 refills | Status: AC

## 2024-11-16 NOTE — Progress Notes (Signed)
 LIBREXIA STROKE RESEARCH  TRIAL 30 DAY FOLLOW UP VISIT NOTE  Patient is seen today for a 30-day stroke trial results follow-up visit.  He states he is doing well.  He has made full neurological recovery.  His left-sided peripheral vision also appears to have improved as the likely sensation.  He is tolerating the prior medications without any side effects.  He also remains on aspirin  and Plavix  which he is tolerating well without significant bruising or bleeding.  He has no complaints today. NIH stroke scale is 0. Modified Rankin score is 1.  Blood pressure today is elevated at 178/100.  Review of possible blood pressure recordings had also shown elevation in similar range.  Patient has not been on blood pressure medications.  She does not yet have a primary care physician.  IMPRESSION : 78 year old Caucasian male with right parietal occipital infarct seen November 2025 secondary to intracranial atherosclerosis and large vessel disease.  Patient is enrolled in the Librexia stroke trial  Recommendation : Continue aspirin  and Plavix  for 3 months aspirin  alone Librexia stroke trial medication..  Continue Lipitor for elevated lipids.  Patient advised to go on a low-salt diet start Norvasc  2.5 mg daily.  He was also advised to find himself a primary care physician for follow-up for his hypertension and hyperlipidemia.  Return for follow-up in the future as per Librexia stroke trial protocol. Discussed with patient and wife and answered questions  Eather Popp, MD

## 2024-12-06 NOTE — Progress Notes (Deleted)
 " OUTPATIENT OCCUPATIONAL THERAPY NEURO EVALUATION  Patient Name: Joshua Golden MRN: [id number] DOB:11/09/1946, 78 y.o., male Today's Date: 12/06/2024  PCP: *** REFERRING PROVIDER: Rosario Leatrice FERNS, MD  END OF SESSION:   Past Medical History:  Diagnosis Date   Allergy    Arthritis    arthritis- fingers   Hypertrophy of prostate with urinary retention    in and out self catheterization x3 daily at present   Thrombocytopenia    hx of in third grade no problems now    UTI (urinary tract infection)    07-25-16 being tx now   Past Surgical History:  Procedure Laterality Date   CYSTOSCOPY     x1 in office.   HERNIA REPAIR     left inguinal hernia repair   PROSTATE BIOPSY N/A 07/30/2016   Procedure: BIOPSY TRANSRECTAL ULTRASONIC PROSTATE (TUBP);  Surgeon: Norleen Seltzer, MD;  Location: [facility];  Service: Urology;  Laterality: N/A;   SHOULDER OPEN ROTATOR CUFF REPAIR  02/05/2012   Procedure: ROTATOR CUFF REPAIR SHOULDER OPEN;  Surgeon: Tanda DELENA Heading, MD;  Location: [facility];  Service: Orthopedics;  Laterality: Right;   TONSILLECTOMY     TRANSURETHRAL RESECTION OF PROSTATE N/A 07/30/2016   Procedure: TRANSURETHRAL RESECTION OF THE PROSTATE (TURP);  Surgeon: Norleen Seltzer, MD;  Location: [facility];  Service: Urology;  Laterality: N/A;   VASECTOMY     Patient Active Problem List   Diagnosis Date Noted   Acute CVA (cerebrovascular accident) (HCC) 10/14/2024   Elevated PSA, less than 10 ng/ml 07/31/2016    ONSET DATE: 10/15/2024  REFERRING DIAG: I63.9 (ICD-10-CM) - Acute CVA (cerebrovascular accident) (HCC)  THERAPY DIAG:  No diagnosis found.  Rationale for Evaluation and Treatment: Rehabilitation  SUBJECTIVE:   SUBJECTIVE STATEMENT: *** Pt accompanied by: {accompnied:27141}  PERTINENT HISTORY: presents to [facility] with incoordination and visual field deficits. CT suggestive of R parietal and occipital stroke. No significant pmhx  PRECAUTIONS: {Therapy  precautions:24002}  WEIGHT BEARING RESTRICTIONS: {Yes ***/No:24003}  PAIN:  Are you having pain? {OPRCPAIN:27236}  FALLS: Has patient fallen in last 6 months? {fallsyesno:27318}  LIVING ENVIRONMENT: Lives with: {OPRC lives with:25569::lives with their family} Lives in: {Lives in:25570} Stairs: {opstairs:27293} Has following equipment at home: {Assistive devices:23999}  PLOF: {PLOF:24004}  PATIENT GOALS: ***  OBJECTIVE:  Note: Objective measures were completed at Evaluation unless otherwise noted.  HAND DOMINANCE: {MISC; OT HAND DOMINANCE:[phone]}  ADLs: Overall ADLs: *** Transfers/ambulation related to ADLs: Eating: *** Grooming: *** UB Dressing: *** LB Dressing: *** Toileting: *** Bathing: *** Tub Shower transfers: *** Equipment: {equipment:25573}  IADLs: Shopping: *** Light housekeeping: *** Meal Prep: *** Community mobility: *** Medication management: *** Financial management: *** Handwriting: {OTWRITTENEXPRESSION:25361}  MOBILITY STATUS: {OTMOBILITY:25360}  POSTURE COMMENTS:  {posture:25561} Sitting balance: {sitting balance:25483}  ACTIVITY TOLERANCE: Activity tolerance: ***  FUNCTIONAL OUTCOME MEASURES: {OTFUNCTIONALMEASURES:27238}  UPPER EXTREMITY ROM:    {AROM/PROM:27142} ROM Right eval Left eval  Shoulder flexion    Shoulder abduction    Shoulder adduction    Shoulder extension    Shoulder internal rotation    Shoulder external rotation    Elbow flexion    Elbow extension    Wrist flexion    Wrist extension    Wrist ulnar deviation    Wrist radial deviation    Wrist pronation    Wrist supination    (Blank rows = not tested)  UPPER EXTREMITY MMT:     MMT Right eval Left eval  Shoulder flexion    Shoulder abduction  Shoulder adduction    Shoulder extension    Shoulder internal rotation    Shoulder external rotation    Middle trapezius    Lower trapezius    Elbow flexion    Elbow extension    Wrist flexion     Wrist extension    Wrist ulnar deviation    Wrist radial deviation    Wrist pronation    Wrist supination    (Blank rows = not tested)  HAND FUNCTION: {handfunction:27230}  COORDINATION: {otcoordination:27237}  SENSATION: {sensation:27233}  EDEMA: ***  MUSCLE TONE: {UETONE:25567}  COGNITION: Overall cognitive status: {cognition:24006}  VISION: Subjective report: *** Baseline vision: {OTBASELINEVISION:25363} Visual history: {OTVISUALHISTORY:25364}  VISION ASSESSMENT: {visionassessment:27231}  Patient has difficulty with following activities due to following visual impairments: ***  PERCEPTION: {Perception:25564}  PRAXIS: {Praxis:25565}  OBSERVATIONS: ***                                                                                                                             TREATMENT DATE: ***         PATIENT EDUCATION: Education details: *** Person educated: {Person educated:25204} Education method: {Education Method:25205} Education comprehension: {Education Comprehension:25206}  HOME EXERCISE PROGRAM: ***   GOALS: Goals reviewed with patient? {yes/no:20286}  SHORT TERM GOALS: Target date: ***  *** Baseline: Goal status: INITIAL  2.  *** Baseline:  Goal status: INITIAL  3.  *** Baseline:  Goal status: INITIAL  4.  *** Baseline:  Goal status: INITIAL  5.  *** Baseline:  Goal status: INITIAL  6.  *** Baseline:  Goal status: INITIAL  LONG TERM GOALS: Target date: ***  *** Baseline:  Goal status: INITIAL  2.  *** Baseline:  Goal status: INITIAL  3.  *** Baseline:  Goal status: INITIAL  4.  *** Baseline:  Goal status: INITIAL  5.  *** Baseline:  Goal status: INITIAL  6.  *** Baseline:  Goal status: INITIAL  ASSESSMENT:  CLINICAL IMPRESSION: Patient is a *** y.o. *** who was seen today for occupational therapy evaluation for ***.   PERFORMANCE DEFICITS: in functional skills including {OT physical  skills:25468}, cognitive skills including {OT cognitive skills:25469}, and psychosocial skills including {OT psychosocial skills:25470}.   IMPAIRMENTS: are limiting patient from {OT performance deficits:25471}.   CO-MORBIDITIES: {Comorbidities:25485} that affects occupational performance. Patient will benefit from skilled OT to address above impairments and improve overall function.  MODIFICATION OR ASSISTANCE TO COMPLETE EVALUATION: {OT modification:25474}  OT OCCUPATIONAL PROFILE AND HISTORY: {OT PROFILE AND HISTORY:25484}  CLINICAL DECISION MAKING: {OT CDM:25475}  REHAB POTENTIAL: {rehabpotential:25112}  EVALUATION COMPLEXITY: {Evaluation complexity:25115}    PLAN:  OT FREQUENCY: {rehab frequency:25116}  OT DURATION: {rehab duration:25117}  PLANNED INTERVENTIONS: {OT Interventions:25467}  RECOMMENDED OTHER SERVICES: ***  CONSULTED AND AGREED WITH PLAN OF CARE: {ENR:74513}  PLAN FOR NEXT SESSION: ***   Burnard JINNY Roads, OT 12/06/2024, 8:36 AM           "

## 2024-12-07 ENCOUNTER — Ambulatory Visit: Admitting: Occupational Therapy

## 2024-12-07 ENCOUNTER — Ambulatory Visit: Admitting: Physical Therapy

## 2024-12-14 ENCOUNTER — Other Ambulatory Visit: Payer: Self-pay

## 2024-12-14 ENCOUNTER — Encounter: Payer: Self-pay | Admitting: Physical Therapy

## 2024-12-14 ENCOUNTER — Ambulatory Visit: Admitting: Occupational Therapy

## 2024-12-14 ENCOUNTER — Encounter: Payer: Self-pay | Admitting: Occupational Therapy

## 2024-12-14 ENCOUNTER — Ambulatory Visit: Attending: Internal Medicine | Admitting: Physical Therapy

## 2024-12-14 DIAGNOSIS — R2681 Unsteadiness on feet: Secondary | ICD-10-CM | POA: Diagnosis present

## 2024-12-14 DIAGNOSIS — R41840 Attention and concentration deficit: Secondary | ICD-10-CM | POA: Diagnosis present

## 2024-12-14 DIAGNOSIS — I639 Cerebral infarction, unspecified: Secondary | ICD-10-CM | POA: Diagnosis not present

## 2024-12-14 DIAGNOSIS — R41844 Frontal lobe and executive function deficit: Secondary | ICD-10-CM | POA: Diagnosis present

## 2024-12-14 DIAGNOSIS — R29818 Other symptoms and signs involving the nervous system: Secondary | ICD-10-CM | POA: Insufficient documentation

## 2024-12-14 DIAGNOSIS — R41842 Visuospatial deficit: Secondary | ICD-10-CM

## 2024-12-14 DIAGNOSIS — R278 Other lack of coordination: Secondary | ICD-10-CM | POA: Insufficient documentation

## 2024-12-14 DIAGNOSIS — R2689 Other abnormalities of gait and mobility: Secondary | ICD-10-CM | POA: Diagnosis present

## 2024-12-14 DIAGNOSIS — M6281 Muscle weakness (generalized): Secondary | ICD-10-CM | POA: Diagnosis present
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
## 2024-12-14 NOTE — Therapy (Signed)
 " OUTPATIENT OCCUPATIONAL THERAPY NEURO EVALUATION  Patient Name: Joshua Golden MRN: [id number] DOB:07-08-1946, 79 y.o., male Today's Date: 12/14/2024  PCP: No PCP REFERRING PROVIDER: Rosario Leatrice FERNS, MD  END OF SESSION:  OT End of Session - 12/14/24 1450     Visit Number 1    Number of Visits 6    Date for Recertification  01/21/25    Authorization Type [employer] [id number]    Authorization Time Period VL: [place] AUTH REQUIRED    OT Start Time 1448    OT Stop Time 1530    OT Time Calculation (min) 42 min    Equipment Utilized During Treatment Testing Material    Activity Tolerance Patient tolerated treatment well    Behavior During Therapy WFL for tasks assessed/performed          Past Medical History:  Diagnosis Date   Allergy    Arthritis    arthritis- fingers   Hypertrophy of prostate with urinary retention    in and out self catheterization x3 daily at present   Thrombocytopenia    hx of in third grade no problems now    UTI (urinary tract infection)    07-25-16 being tx now   Past Surgical History:  Procedure Laterality Date   CYSTOSCOPY     x1 in office.   HERNIA REPAIR     left inguinal hernia repair   PROSTATE BIOPSY N/A 07/30/2016   Procedure: BIOPSY TRANSRECTAL ULTRASONIC PROSTATE (TUBP);  Surgeon: Norleen Seltzer, MD;  Location: [facility];  Service: Urology;  Laterality: N/A;   SHOULDER OPEN ROTATOR CUFF REPAIR  02/05/2012   Procedure: ROTATOR CUFF REPAIR SHOULDER OPEN;  Surgeon: Tanda DELENA Heading, MD;  Location: [facility];  Service: Orthopedics;  Laterality: Right;   TONSILLECTOMY     TRANSURETHRAL RESECTION OF PROSTATE N/A 07/30/2016   Procedure: TRANSURETHRAL RESECTION OF THE PROSTATE (TURP);  Surgeon: Norleen Seltzer, MD;  Location: [facility];  Service: Urology;  Laterality: N/A;   VASECTOMY     Patient Active Problem List   Diagnosis Date Noted   Acute CVA (cerebrovascular accident) (HCC) 10/14/2024   Elevated PSA, less than 10 ng/ml 07/31/2016    ONSET DATE:  10/15/2024  REFERRING DIAG: I63.9 (ICD-10-CM) - Acute CVA (cerebrovascular accident) (HCC)  THERAPY DIAG:  Visuospatial deficit  Other symptoms and signs involving the nervous system  Other lack of coordination  Attention and concentration deficit  Frontal lobe and executive function deficit  Rationale for Evaluation and Treatment: Rehabilitation  SUBJECTIVE:   SUBJECTIVE STATEMENT:   Pt accompanied by: self and significant other - wife Joshua Golden - 55 yrs  Pt presented today for OT/PT evaluations s/p recent stroke.  He reports he had a headache before the stroke which resulted in visual changes ie) lost sight below nose/chin but reported that this has substantially improved.  He was discouraged from driving but wants to get back to driving. Pt reports he has no residual weakness from the stroke but that he occasionally needs help from his wife ie) to explain things more thoroughly for him.  Pt was not on medication before this hospitalization but is now on high blood pressure medication.    Pt reports some previous hip issues.  PERTINENT HISTORY:  PMHX: arthritis and BPH with urinary retention.    Patient presented to the ED 10/14/24 via EMS as a Code Stroke for left facial droop, left sided vision loss and left sided numbness. Symptoms seem to have started 10/13/24  around 1500 based  on conversations with patient and wife. At that time, wife wanted him to be evaluated, but he refused. They went for a walk 11/6 and he ran into a post that was in plain sight. He then decided to be evaluated at Urgent Care. From there EMS was called and he was brought to the [facility] ED, admitted for stroke workup after [facility] showed subacute infarct.S[facility] on Admission: 6.  Neurology team directed stroke management.    PRECAUTIONS: Fall; Hard of hearing (R hearing is better - has hearing aides but does not like to wear them out of the house out of fear that he will lose them)   WEIGHT BEARING RESTRICTIONS:  No  PAIN:  Are you having pain? No  FALLS: Has patient fallen in last 6 months? No  LIVING ENVIRONMENT: Lives with: lives with their spouse Lives in: Mobile home - tiny home Airstream Stairs: Yes: Internal: 3 steps; on left going up Has following equipment at home: walk in shower  PLOF: Independent, Independent with basic ADLs, Independent with gait, Independent with transfers, and Leisure: Fishing, dietitian, works on things on the farm (fence, lobbyist, hydrologist, run the tractor)  PATIENT GOALS: return to driving  OBJECTIVE:  Note: Objective measures were completed at Evaluation unless otherwise noted.  HAND DOMINANCE: Right - did lose ability to sign name initially but reports he has resumed this.  ADLs: Overall ADLs: Mod Ind  IADLs: Shopping: Goes with wife but is not driving Light housekeeping: Helped to take the laundry to the washer in the milk parlor,  Meal Prep: Does help with meal prep Community mobility: Travels with spouse Medication management: Ind with pill bottles Financial management: With spouse Handwriting: NT Hobbies: Pt reported he went fishing with his friends 1x since return home  MOBILITY STATUS: Independent  POSTURE COMMENTS:  No Significant postural limitations Sitting balance: WFL  ACTIVITY TOLERANCE: Activity tolerance: Good  UPPER EXTREMITY ROM:   BUE ROM - Generally WFL  UPPER EXTREMITY MMT:     Strength Right eval Left eval  Shoulder flexion 4- 4+  Shoulder abduction    Shoulder adduction    Shoulder extension    Shoulder internal rotation    Shoulder external rotation    Elbow flexion 4+ 5  Elbow extension 4+ 5  Wrist flexion    Wrist extension    Wrist ulnar deviation    Wrist radial deviation    Wrist pronation    Wrist supination    (Blank rows = not tested)  HAND FUNCTION: Grip strength: Right: 84.2, 87.0 lbs; Left: 79.1, 74.7 lbs Average: Right 85.6 lbs; Left 76.9 lbs  COORDINATION: 9 Hole Peg  test: Right: 24.97 sec; Left: 32.11  sec  SENSATION: WFL - Pt reports initial sensory changes but pretty good since then  EDEMA: NA  MUSCLE TONE: WFL  COGNITION: Overall cognitive status: slight changes in memory since stroke  VISION: Subjective report: Pt reports he has history of diplopia/double vision and that he has prisms in his glasses Per Hospital records: Wears glasses with a corrective prism  (suspected in L lens). Wife reporting pt had a history of diplopia ~6 years ago and updated glasses have corrected his vision. 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 Peripheral vision impaired - pt unable to detect stim past midline, although binocular EOM intact. Functionally, pt was no more than close supervision for all mobility and UB/LB ADLs. Presents with delayed processing during conversation & task execution.  Vision Baseline Vision/History: Wears glasses (with corrective prism  (suspect in L lens due to thickened lens), wife reporting pt had previous diplopia which was corrected with progressive prism  over time ~6 years ago) Ability to See in Adequate Light: Adequate Patient Visual Report @ Hospital: Peripheral vision impairment Visual history: Pt reports that he had initial indications of cataracts at eye exam > 1 year ago but has not been evaluated since the stroke   VISION ASSESSMENT: Ocular Range of Motion: Within Functional Limits   Visual screening reveals improvement in previously suspected visual field impairment (possible hemianopsia). Pt demonstrates ability to track visual targets in all directions; however, smooth pursuits are not fully fluid, with intermittent corrective saccades noted. R eye intermittently turns inward, suggesting possible ocular alignment inconsistency. Visual attention and tracking accuracy decline with increased duration of task and task complexity or dual-task demands, indicating reduced  visual-cognitive integration during multitasking.   Patient has difficulty with following activities due to following visual impairments: None reported  OBSERVATIONS: Pt ambulates with no AE and no loss of balance. The pt appears well kept and has glasses donned.                                                                                                                            TODAY'S TREATMENT:    OT educated pt on rehabilitation process and results of objective measures in relation to pt specific goals. Initiated vision activity recommendations to practice at home ie) - scanning road signs as passenger in vehicle, helping navigate as passenger, word search and puzzles  PATIENT EDUCATION: Education details: OT role and POC Considerations Person educated: Patient and Spouse Education method: Explanation and Verbal cues Education comprehension: verbalized understanding, verbal cues required, and needs further education  HOME EXERCISE PROGRAM: TBD   GOALS: Goals reviewed with patient? Yes  LONG TERM GOALS: Target date: 01/21/25  Pt will verbalize and demonstrate understanding of at least 3 vision-related compensatory strategies and home exercises to support carryover and self-management following discharge. Baseline: Currently not driving Goal status: INITIAL  2.  Pt will demonstrate >=90% accuracy with simple environmental and >=90% with visual distracting environment during visual scanning during structured and functional tasks (e.g., locating items, reading signs, navigating environment) to support safety and independence with ADLs/IADLs, including higher-level community mobility considerations. Baseline: Currently not driving Goal status: INITIAL  3.  Pt will demonstrate accurate environmental visual scanning while engaging in conversation, identifying >=85% of visual targets without loss of task performance, to improve safety during real-world multitasking. Baseline:  Currently not driving Goal status: INITIAL  4. Pt will verbalize understanding of ways to keep thinking skills sharp and ways to compensate for STM changes. Baseline:  Pt reports slight changes in memory since stroke Goal status: INITIAL  ASSESSMENT:  CLINICAL IMPRESSION: Patient is a 79 y.o. male who was seen today for occupational therapy evaluation for visual changes s/p CVA. Hx includes arthritis and BPH with urinary retention. Patient currently  presents below baseline level of function with vision s/p stroke demonstrating functional deficits and impairments impacting ability to drive. Pt would benefit from skilled OT services in the outpatient setting to work on impairments as noted below to help pt return to PLOF as able.     PERFORMANCE DEFICITS: in functional skills including IADLs, coordination, decreased knowledge of precautions, and vision, cognitive skills including safety awareness, and psychosocial skills including coping strategies.   IMPAIRMENTS: are limiting patient from IADLs and leisure.   CO-MORBIDITIES: has no other co-morbidities that affects occupational performance. Patient will benefit from skilled OT to address above impairments and improve overall function.  MODIFICATION OR ASSISTANCE TO COMPLETE EVALUATION: Min-Moderate modification of tasks or assist with assess necessary to complete an evaluation.  OT OCCUPATIONAL PROFILE AND HISTORY: Detailed assessment: Review of records and additional review of physical, cognitive, psychosocial history related to current functional performance.  CLINICAL DECISION MAKING: Moderate - several treatment options, min-mod task modification necessary  REHAB POTENTIAL: Excellent  EVALUATION COMPLEXITY: Moderate    PLAN:  OT FREQUENCY: 1x/week  OT DURATION: up to 6 weeks - currently scheduled for 4  PLANNED INTERVENTIONS: 97168 OT Re-evaluation, 97535 self care/ADL training, [id number] therapeutic exercise, 97530 therapeutic  activity, 97112 neuromuscular re-education, functional mobility training, visual/perceptual remediation/compensation, energy conservation, coping strategies training, patient/family education, and DME and/or AE instructions  RECOMMENDED OTHER SERVICES: PT eval conducted this date  CONSULTED AND AGREED WITH PLAN OF CARE: Patient and family adult nurse  PLAN FOR NEXT SESSION: Visual scanning challenges, puzzle, golf solitaire, memory strategies   Clarita LITTIE Pride, OT 12/14/2024, 2:51 PM           "

## 2024-12-28 ENCOUNTER — Encounter: Payer: Self-pay | Admitting: Physical Therapy

## 2024-12-28 ENCOUNTER — Ambulatory Visit: Admitting: Occupational Therapy

## 2024-12-28 ENCOUNTER — Ambulatory Visit: Admitting: Physical Therapy

## 2024-12-28 DIAGNOSIS — R41842 Visuospatial deficit: Secondary | ICD-10-CM

## 2024-12-28 DIAGNOSIS — R2689 Other abnormalities of gait and mobility: Secondary | ICD-10-CM

## 2024-12-28 DIAGNOSIS — R278 Other lack of coordination: Secondary | ICD-10-CM

## 2024-12-28 DIAGNOSIS — R2681 Unsteadiness on feet: Secondary | ICD-10-CM

## 2024-12-28 DIAGNOSIS — M6281 Muscle weakness (generalized): Secondary | ICD-10-CM

## 2024-12-28 DIAGNOSIS — R41840 Attention and concentration deficit: Secondary | ICD-10-CM

## 2024-12-28 DIAGNOSIS — R29818 Other symptoms and signs involving the nervous system: Secondary | ICD-10-CM

## 2024-12-28 NOTE — Patient Instructions (Signed)
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
## 2024-12-28 NOTE — Therapy (Signed)
 " OUTPATIENT OCCUPATIONAL THERAPY NEURO TREATMENT  Patient Name: Joshua Golden MRN: [id number] DOB:[birth year], 79 y.o., male Today's Date: 12/28/2024  PCP: No PCP REFERRING PROVIDER: Rosario Leatrice FERNS, MD  END OF SESSION:  OT End of Session - 12/28/24 0848     Visit Number 2    Number of Visits 6    Date for Recertification  01/21/25    Authorization Type [employer] [id number]    Authorization Time Period VL: [place] AUTH REQUIRED    OT Start Time 0848    OT Stop Time 0930    OT Time Calculation (min) 42 min    Equipment Utilized During Treatment Spot It game, visual scanning task    Activity Tolerance Patient tolerated treatment well    Behavior During Therapy WFL for tasks assessed/performed          Past Medical History:  Diagnosis Date   Allergy    Arthritis    arthritis- fingers   Hypertrophy of prostate with urinary retention    in and out self catheterization x3 daily at present   Thrombocytopenia    hx of in third grade no problems now    UTI (urinary tract infection)    07-25-16 being tx now   Past Surgical History:  Procedure Laterality Date   CYSTOSCOPY     x1 in office.   HERNIA REPAIR     left inguinal hernia repair   PROSTATE BIOPSY N/A 07/30/2016   Procedure: BIOPSY TRANSRECTAL ULTRASONIC PROSTATE (TUBP);  Surgeon: Joshua Seltzer, MD;  Location: [facility];  Service: Urology;  Laterality: N/A;   SHOULDER OPEN ROTATOR CUFF REPAIR  02/05/2012   Procedure: ROTATOR CUFF REPAIR SHOULDER OPEN;  Surgeon: Joshua DELENA Heading, MD;  Location: [facility];  Service: Orthopedics;  Laterality: Right;   TONSILLECTOMY     TRANSURETHRAL RESECTION OF PROSTATE N/A 07/30/2016   Procedure: TRANSURETHRAL RESECTION OF THE PROSTATE (TURP);  Surgeon: Joshua Seltzer, MD;  Location: [facility];  Service: Urology;  Laterality: N/A;   VASECTOMY     Patient Active Problem List   Diagnosis Date Noted   Acute CVA (cerebrovascular accident) (HCC) 10/14/2024   Elevated PSA, less than 10 ng/ml 07/31/2016    ONSET  DATE: 10/15/2024  REFERRING DIAG: I63.9 (ICD-10-CM) - Acute CVA (cerebrovascular accident) (HCC)  THERAPY DIAG:  Visuospatial deficit  Other symptoms and signs involving the nervous system  Other lack of coordination  Attention and concentration deficit  Rationale for Evaluation and Treatment: Rehabilitation  SUBJECTIVE:   SUBJECTIVE STATEMENT:   Pt accompanied by: self and significant other - wife Joshua Golden  Pt presented today for OT/PT today with wife.  Pt reported they have been getting out and walking ie) yesterday they walked a paved path in [place] walking and pt walks his dog daily in pasture around their home.  PERTINENT HISTORY:  PMHX: arthritis and BPH with urinary retention.    Patient presented to the ED 10/14/24 via EMS as a Code Stroke for left facial droop, left sided vision loss and left sided numbness. Symptoms seem to have started 10/13/24  around 1500 based on conversations with patient and wife. At that time, wife wanted him to be evaluated, but he refused. They went for a walk 11/6 and he ran into a post that was in plain sight. He then decided to be evaluated at Urgent Care. From there EMS was called and he was brought to [facility] ED, admitted for stroke workup after [facility] showed subacute infarct.[facility] on  Admission: 6.  Neurology team directed stroke management.    PRECAUTIONS: Fall; Hard of hearing (R hearing is better - has hearing aides but does not like to wear them out of the house out of fear that he will lose them)   WEIGHT BEARING RESTRICTIONS: No  PAIN:  Are you having pain? No  FALLS: Has patient fallen in last 6 months? No  LIVING ENVIRONMENT: Lives with: lives with their spouse Lives in: Mobile home - tiny home [employer] Stairs: Yes: Internal: 3 steps; on left going up Has following equipment at home: walk in shower  PLOF: Independent, Independent with basic ADLs, Independent with gait, Independent with transfers, and Leisure: Fishing, dietitian,  works on things on the farm (fence, lobbyist, hydrologist, run the tractor)  PATIENT GOALS: return to driving  OBJECTIVE:  Note: Objective measures were completed at Evaluation unless otherwise noted.  HAND DOMINANCE: Right - did lose ability to sign name initially but reports he has resumed this.  ADLs: Overall ADLs: Mod Ind  IADLs: Shopping: Goes with wife but is not driving Light housekeeping: Helped to take the laundry to the washer in the milk parlor,  Meal Prep: Does help with meal prep Community mobility: Travels with spouse Medication management: Ind with pill bottles Financial management: With spouse Handwriting: NT Hobbies: Pt reported he went fishing with his friends 1x since return home  MOBILITY STATUS: Independent  POSTURE COMMENTS:  No Significant postural limitations Sitting balance: WFL  ACTIVITY TOLERANCE: Activity tolerance: Good  UPPER EXTREMITY ROM:   BUE ROM - Generally WFL  UPPER EXTREMITY MMT:     Strength Right eval Left eval  Shoulder flexion 4- 4+  Shoulder abduction    Shoulder adduction    Shoulder extension    Shoulder internal rotation    Shoulder external rotation    Elbow flexion 4+ 5  Elbow extension 4+ 5  Wrist flexion    Wrist extension    Wrist ulnar deviation    Wrist radial deviation    Wrist pronation    Wrist supination    (Blank rows = not tested)  HAND FUNCTION: Grip strength: Right: 84.2, 87.0 lbs; Left: 79.1, 74.7 lbs Average: Right 85.6 lbs; Left 76.9 lbs  COORDINATION: 9 Hole Peg test: Right: 24.97 sec; Left: 32.11  sec  SENSATION: WFL - Pt reports initial sensory changes but pretty good since then  EDEMA: NA  MUSCLE TONE: WFL  COGNITION: Overall cognitive status: slight changes in memory since stroke  VISION: Subjective report: Pt reports he has history of diplopia/double vision and that he has prisms in his glasses Per Hospital records: Wears glasses with a corrective prism   (suspected in L lens). Wife reporting pt had a history of diplopia ~6 years ago and updated glasses have corrected his vision. 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 Peripheral vision impaired - pt unable to detect stim past midline, although binocular EOM intact. Functionally, pt was no more than close supervision for all mobility and UB/LB ADLs. Presents with delayed processing during conversation & task execution.   Vision Baseline Vision/History: Wears glasses (with corrective prism  (suspect in L lens due to thickened lens), wife reporting pt had previous diplopia which was corrected with progressive prism  over time ~6 years ago) Ability to See in Adequate Light: Adequate Patient Visual Report @ Hospital: Peripheral vision impairment Visual history: Pt reports that he had initial indications of cataracts at eye exam > 1 year ago  but has not been evaluated since the stroke   VISION ASSESSMENT: Ocular Range of Motion: Within Functional Limits   Visual screening reveals improvement in previously suspected visual field impairment (possible hemianopsia). Pt demonstrates ability to track visual targets in all directions; however, smooth pursuits are not fully fluid, with intermittent corrective saccades noted. R eye intermittently turns inward, suggesting possible ocular alignment inconsistency. Visual attention and tracking accuracy decline with increased duration of task and task complexity or dual-task demands, indicating reduced visual-cognitive integration during multitasking.   Patient has difficulty with following activities due to following visual impairments: None reported  OBSERVATIONS: Pt ambulates with no AE and no loss of balance. The pt appears well kept and has glasses donned.                                                                                                                            TODAY'S TREATMENT:    -  Self Care education and training completed for duration as noted below including:  Patient was educated on memory compensation strategies to support cognitive functioning and independence in daily activities. The following strategies were introduced and discussed: WARM strategy (Write it down, Associate it, Repeat it, Make a mental picture) to enhance memory encoding and retrieval. Use of a Memory Notebook with organized sections (calendar, contacts, medications, daily journal) to support structured recall. Implementation of daily schedules and calendar use for routine planning. Use of medication organizers and assistance with setup as needed. Environmental modifications including a basket/pegboard system near the exit for essential items and reminders. Placement of sticky notes in task-relevant locations to cue memory. Use of alarms, timers, and reminder apps for task initiation and completion. Introduction to voice recording tools for capturing and reviewing important information.  Education provided also with home based activities ie) puzzles, scanning in grocery store, negotiating as a passenger etc.   - Therapeutic activities completed for duration as noted below including:  Pt participated in therapeutic activities targeting visual scanning, visual discrimination, visual attention, and functional mobility safety secondary to vision deficits s/p CVA.  Pt completed a tabletop Spot It game involving 10 card sets to address visual discrimination and visual scanning. Pt required extra time to accurately locate matching images between cards. OT intermittently reoriented and repositioned cards to reduce visual complexity and provide environmental supports, allowing pt to complete task with improved accuracy. Pt demonstrated mild to moderate difficulty with efficient visual search patterns and benefitted from verbal cues to slow pace and systematically scan.  Pt did not previously difficulty with  lower visual quadrants but does not it is improving.  Pt then engaged in functional visual scanning task in the hallway, locating numbered cards placed on both the left and right sides of the environment to simulate community navigation demands. Pt missed 2-5 targets, with errors occurring primarily for items placed above or below body height rather than at midline visual field. OT provided verbal cues for head and eye movement  and reinforced use of organized scanning strategies (top-to-bottom and side-to-side).  PATIENT EDUCATION: Education details: Stage Manager Person educated: Patient and Spouse Education method: Explanation, Demonstration, Verbal cues, and Handouts Education comprehension: verbalized understanding, returned demonstration, verbal cues required, and needs further education  HOME EXERCISE PROGRAM: 12/28/24: Memory/WARM strategies   GOALS: Goals reviewed with patient? Yes  LONG TERM GOALS: Target date: 01/21/25  Pt will verbalize and demonstrate understanding of at least 3 vision-related compensatory strategies and home exercises to support carryover and self-management following discharge. Baseline: Currently not driving Goal status: IN Progress  2.  Pt will demonstrate >=90% accuracy with simple environmental and >=90% with visual distracting environment during visual scanning during structured and functional tasks (e.g., locating items, reading signs, navigating environment) to support safety and independence with ADLs/IADLs, including higher-level community mobility considerations. Baseline: Currently not driving Goal status: IN Progress  3.  Pt will demonstrate accurate environmental visual scanning while engaging in conversation, identifying >=85% of visual targets without loss of task performance, to improve safety during real-world multitasking. Baseline: Currently not driving Goal status: IN Progress  4. Pt will verbalize understanding of ways to keep  thinking skills sharp and ways to compensate for STM changes. Baseline:  Pt reports slight changes in memory since stroke Goal status: IN Progress  ASSESSMENT:  CLINICAL IMPRESSION: Patient is a 79 y.o. male who was seen today for occupational therapy treatment for visual changes s/p CVA. Patient engaged in visual scanning activities to improve accuracy with scanning and teach comp strategies to improve multi tasking as needed to drive. Pt tolerated session well and demonstrated engagement throughout. Activities were graded to challenge visual processing while maintaining safety and task success.  Pt would benefit from skilled OT services in the outpatient setting to work on visual impairments to help pt return to [facility] and driving as deemed able.    PERFORMANCE DEFICITS: in functional skills including IADLs, coordination, decreased knowledge of precautions, and vision, cognitive skills including safety awareness, and psychosocial skills including coping strategies.   IMPAIRMENTS: are limiting patient from IADLs and leisure.   CO-MORBIDITIES: has no other co-morbidities that affects occupational performance. Patient will benefit from skilled OT to address above impairments and improve overall function.  REHAB POTENTIAL: Excellent  PLAN:  OT FREQUENCY: 1x/week  OT DURATION: up to 6 weeks - currently scheduled for 4  PLANNED INTERVENTIONS: 97168 OT Re-evaluation, 97535 self care/ADL training, 02889 therapeutic exercise, 97530 therapeutic activity, 97112 neuromuscular re-education, functional mobility training, visual/perceptual remediation/compensation, energy conservation, coping strategies training, patient/family education, and DME and/or AE instructions  RECOMMENDED OTHER SERVICES: PT eval conducted this date  CONSULTED AND AGREED WITH PLAN OF CARE: Patient and family member/caregiver  PLAN FOR NEXT SESSION: Visual scanning challenges, puzzle, golf solitaire, memory strategies Mirror  scanning simulation Peripheral awareness drills  Clarita LITTIE Pride, OT 12/28/2024, 9:37 AM           "

## 2025-01-05 ENCOUNTER — Ambulatory Visit: Admitting: Physical Therapy

## 2025-01-05 ENCOUNTER — Encounter: Payer: Self-pay | Admitting: Physical Therapy

## 2025-01-05 ENCOUNTER — Ambulatory Visit: Admitting: Occupational Therapy

## 2025-01-05 DIAGNOSIS — M6281 Muscle weakness (generalized): Secondary | ICD-10-CM

## 2025-01-05 DIAGNOSIS — R278 Other lack of coordination: Secondary | ICD-10-CM

## 2025-01-05 DIAGNOSIS — R29818 Other symptoms and signs involving the nervous system: Secondary | ICD-10-CM

## 2025-01-05 DIAGNOSIS — R2681 Unsteadiness on feet: Secondary | ICD-10-CM

## 2025-01-05 DIAGNOSIS — R41842 Visuospatial deficit: Secondary | ICD-10-CM

## 2025-01-05 DIAGNOSIS — R2689 Other abnormalities of gait and mobility: Secondary | ICD-10-CM

## 2025-01-05 NOTE — Therapy (Unsigned)
 " OUTPATIENT OCCUPATIONAL THERAPY NEURO TREATMENT  Patient Name: Joshua Golden MRN: [id number] DOB:[birth year], 79 y.o., male Today's Date: 01/05/2025  PCP: No PCP REFERRING PROVIDER: Rosario Leatrice FERNS, MD  END OF SESSION:  OT End of Session - 01/05/25 1356     Visit Number 3    Number of Visits 6    Date for Recertification  01/21/25    Authorization Type [employer] [id number]    Authorization Time Period VL: [place] AUTH REQUIRED    OT Start Time 1359    OT Stop Time 1445    OT Time Calculation (min) 46 min    Equipment Utilized During Treatment Blaze Pods    Activity Tolerance Patient tolerated treatment well    Behavior During Therapy WFL for tasks assessed/performed          Past Medical History:  Diagnosis Date   Allergy    Arthritis    arthritis- fingers   Hypertrophy of prostate with urinary retention    in and out self catheterization x3 daily at present   Thrombocytopenia    hx of in third grade no problems now    UTI (urinary tract infection)    07-25-16 being tx now   Past Surgical History:  Procedure Laterality Date   CYSTOSCOPY     x1 in office.   HERNIA REPAIR     left inguinal hernia repair   PROSTATE BIOPSY N/A 07/30/2016   Procedure: BIOPSY TRANSRECTAL ULTRASONIC PROSTATE (TUBP);  Surgeon: Norleen Seltzer, MD;  Location: [facility];  Service: Urology;  Laterality: N/A;   SHOULDER OPEN ROTATOR CUFF REPAIR  02/05/2012   Procedure: ROTATOR CUFF REPAIR SHOULDER OPEN;  Surgeon: Tanda DELENA Heading, MD;  Location: [facility];  Service: Orthopedics;  Laterality: Right;   TONSILLECTOMY     TRANSURETHRAL RESECTION OF PROSTATE N/A 07/30/2016   Procedure: TRANSURETHRAL RESECTION OF THE PROSTATE (TURP);  Surgeon: Norleen Seltzer, MD;  Location: [facility];  Service: Urology;  Laterality: N/A;   VASECTOMY     Patient Active Problem List   Diagnosis Date Noted   Acute CVA (cerebrovascular accident) (HCC) 10/14/2024   Elevated PSA, less than 10 ng/ml 07/31/2016    ONSET DATE:  10/15/2024  REFERRING DIAG: I63.9 (ICD-10-CM) - Acute CVA (cerebrovascular accident) (HCC)  THERAPY DIAG:  Other lack of coordination  Other symptoms and signs involving the nervous system  Visuospatial deficit  Muscle weakness (generalized)  Rationale for Evaluation and Treatment: Rehabilitation  SUBJECTIVE:   SUBJECTIVE STATEMENT:   Pt accompanied by: self   Pt seen after PT by himself.  Pt reported he got his cane out to take the dog out in the weather this week/end.  PERTINENT HISTORY:  PMHX: arthritis and BPH with urinary retention.    Patient presented to the ED 10/14/24 via EMS as a Code Stroke for left facial droop, left sided vision loss and left sided numbness. Symptoms seem to have started 10/13/24  around 1500 based on conversations with patient and wife. At that time, wife wanted him to be evaluated, but he refused. They went for a walk 11/6 and he ran into a post that was in plain sight. He then decided to be evaluated at Urgent Care. From there EMS was called and he was brought to the [facility] ED, admitted for stroke workup after [facility] showed subacute infarct.[facility] on Admission: 6.  Neurology team directed stroke management.    PRECAUTIONS: Fall; Hard of hearing (R hearing is better - has hearing aides but does  not like to wear them out of the house out of fear that he will lose them)   WEIGHT BEARING RESTRICTIONS: No  PAIN:  Are you having pain? No  FALLS: Has patient fallen in last 6 months? No  LIVING ENVIRONMENT: Lives with: lives with their spouse Lives in: Mobile home - tiny home Airstream Stairs: Yes: Internal: 3 steps; on left going up Has following equipment at home: walk in shower  PLOF: Independent, Independent with basic ADLs, Independent with gait, Independent with transfers, and Leisure: Fishing, dietitian, works on things on the farm (fence, lobbyist, hydrologist, run the tractor)  PATIENT GOALS: return to driving  OBJECTIVE:  Note:  Objective measures were completed at Evaluation unless otherwise noted.  HAND DOMINANCE: Right - did lose ability to sign name initially but reports he has resumed this.  ADLs: Overall ADLs: Mod Ind  IADLs: Shopping: Goes with wife but is not driving Light housekeeping: Helped to take the laundry to the washer in the milk parlor,  Meal Prep: Does help with meal prep Community mobility: Travels with spouse Medication management: Ind with pill bottles Financial management: With spouse Handwriting: NT Hobbies: Pt reported he went fishing with his friends 1x since return home  MOBILITY STATUS: Independent  POSTURE COMMENTS:  No Significant postural limitations Sitting balance: WFL  ACTIVITY TOLERANCE: Activity tolerance: Good  UPPER EXTREMITY ROM:   BUE ROM - Generally WFL  UPPER EXTREMITY MMT:     Strength Right eval Left eval  Shoulder flexion 4- 4+  Shoulder abduction    Shoulder adduction    Shoulder extension    Shoulder internal rotation    Shoulder external rotation    Elbow flexion 4+ 5  Elbow extension 4+ 5  Wrist flexion    Wrist extension    Wrist ulnar deviation    Wrist radial deviation    Wrist pronation    Wrist supination    (Blank rows = not tested)  HAND FUNCTION: Grip strength: Right: 84.2, 87.0 lbs; Left: 79.1, 74.7 lbs Average: Right 85.6 lbs; Left 76.9 lbs  COORDINATION: 9 Hole Peg test: Right: 24.97 sec; Left: 32.11  sec  SENSATION: WFL - Pt reports initial sensory changes but pretty good since then  EDEMA: NA  MUSCLE TONE: WFL  COGNITION: Overall cognitive status: slight changes in memory since stroke  VISION: Subjective report: Pt reports he has history of diplopia/double vision and that he has prisms in his glasses Per Hospital records: Wears glasses with a corrective prism  (suspected in L lens). Wife reporting pt had a history of diplopia ~6 years ago and updated glasses have corrected his vision. 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 Peripheral vision impaired - pt unable to detect stim past midline, although binocular EOM intact. Functionally, pt was no more than close supervision for all mobility and UB/LB ADLs. Presents with delayed processing during conversation & task execution.   Vision Baseline Vision/History: Wears glasses (with corrective prism  (suspect in L lens due to thickened lens), wife reporting pt had previous diplopia which was corrected with progressive prism  over time ~6 years ago) Ability to See in Adequate Light: Adequate Patient Visual Report @ Hospital: Peripheral vision impairment Visual history: Pt reports that he had initial indications of cataracts at eye exam > 1 year ago but has not been evaluated since the stroke   VISION ASSESSMENT: Ocular Range of Motion: Within Functional Limits   Visual screening reveals improvement in  previously suspected visual field impairment (possible hemianopsia). Pt demonstrates ability to track visual targets in all directions; however, smooth pursuits are not fully fluid, with intermittent corrective saccades noted. R eye intermittently turns inward, suggesting possible ocular alignment inconsistency. Visual attention and tracking accuracy decline with increased duration of task and task complexity or dual-task demands, indicating reduced visual-cognitive integration during multitasking.   Patient has difficulty with following activities due to following visual impairments: None reported  OBSERVATIONS: Pt ambulates with no AE and no loss of balance. The pt appears well kept and has glasses donned.                                                                                                                            TODAY'S TREATMENT:    - Self Care education and training completed for duration as noted below including:  RETURN TO DRIVING PLAN ONLY IF CLEARED BY DOCTOR:   - Therapeutic  activities completed for duration as noted below including:  Pt participated in therapeutic activities targeting visual scanning, visual discrimination, visual attention, and functional mobility safety secondary to vision deficits s/p CVA.  OT placed *** 6 BlazePods in front of patient and had patient tap pods with {podhits:32016} as pods lit up using {podmode:32017} mode for{limitations:32018}.   Hits were as follows:  {UE:30451}: *** hits for 2 min duration and 1.*** sec reaction time {UE:30451}: *** hits for 2 min duration and 1.*** sec reaction time   avg reaction 1 minutes x 3 reps  Trial 1 - 41 (1.289) 2 - 44 (1.178)  3 - 50 (1.006   Distraction mode x 2 minutes Trial 1 - 77 ( 1.344)  2 - 79 (1.316) 3 - 92 (1.108)  PATIENT EDUCATION: Education details: Return to Restaurant Manager, [employer] provided Person educated: Patient and Spouse Education method: Programmer, Multimedia, Facilities Manager, Verbal cues, and Handouts Education comprehension: verbalized understanding, returned demonstration, verbal cues required, and needs further education  HOME EXERCISE PROGRAM: 12/28/24: Memory/WARM strategies 01/05/25: Return to Driving plan for discussion with MD   GOALS: Goals reviewed with patient? Yes  LONG TERM GOALS: Target date: 01/21/25  Pt will verbalize and demonstrate understanding of at least 3 vision-related compensatory strategies and home exercises to support carryover and self-management following discharge. Baseline: Currently not driving Goal status: IN Progress Scanning,   2.  Pt will demonstrate >=90% accuracy with simple environmental and >=90% with visual distracting environment during visual scanning during structured and functional tasks (e.g., locating items, reading signs, navigating environment) to support safety and independence with ADLs/IADLs, including higher-level community mobility considerations. Baseline: Currently not driving Goal status: IN Progress  3.  Pt will demonstrate  accurate environmental visual scanning while engaging in conversation, identifying >=85% of visual targets without loss of task performance, to improve safety during real-world multitasking. Baseline: Currently not driving Goal status: IN Progress  4. Pt will verbalize understanding of ways to keep thinking skills sharp and ways to compensate for STM changes. Baseline:  Pt reports slight changes in memory since stroke Goal status: IN Progress  ASSESSMENT:  CLINICAL IMPRESSION: Patient is a 79 y.o. male who was seen today for occupational therapy treatment for visual changes s/p CVA. Patient engaged in visual scanning activities to improve accuracy with scanning and teach comp strategies to improve multi tasking as needed to drive. Pt tolerated session well and demonstrated engagement throughout. Activities were graded to challenge visual processing while maintaining safety and task success.  Pt would benefit from skilled OT services in the outpatient setting to work on visual impairments to help pt return to [facility] and driving as deemed able.    PERFORMANCE DEFICITS: in functional skills including IADLs, coordination, decreased knowledge of precautions, and vision, cognitive skills including safety awareness, and psychosocial skills including coping strategies.   IMPAIRMENTS: are limiting patient from IADLs and leisure.   CO-MORBIDITIES: has no other co-morbidities that affects occupational performance. Patient will benefit from skilled OT to address above impairments and improve overall function.  REHAB POTENTIAL: Excellent  PLAN:  OT FREQUENCY: 1x/week  OT DURATION: up to 6 weeks - currently scheduled for 4  PLANNED INTERVENTIONS: 97168 OT Re-evaluation, 97535 self care/ADL training, 02889 therapeutic exercise, 97530 therapeutic activity, 97112 neuromuscular re-education, functional mobility training, visual/perceptual remediation/compensation, energy conservation, coping strategies  training, patient/family education, and DME and/or AE instructions  RECOMMENDED OTHER SERVICES: PT eval conducted this date  CONSULTED AND AGREED WITH PLAN OF CARE: Patient and family member/caregiver  PLAN FOR NEXT SESSION: Visual scanning challenges, puzzle, golf solitaire, memory strategies Mirror scanning simulation Peripheral awareness drills  Clarita LITTIE Pride, OT 01/05/2025, 1:58 PM           "
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
## 2025-01-05 NOTE — Patient Instructions (Signed)
-   Squat  - 1 x daily - 4 x weekly - 2 sets - 10 reps - Kettlebell Deadlift  - 1 x daily - 4 x weekly - 2 sets - 10 reps - Forward T with Counter Support  - 1 x daily - 4 x weekly - 1-2 sets - 10 reps - Standing Shoulder Row with Anchored Resistance  - 1 x daily - 4 x weekly - 2 sets - 15 reps - Anti-Rotation Sidestepping with Resistance  - 1 x daily - 4 x weekly - 1-2 sets - 10 reps

## 2025-01-05 NOTE — Patient Instructions (Signed)
 RETURN TO DRIVING PLAN ONLY IF CLEARED BY DOCTOR:   WITH THE SUPERVISION OF A LICENSED DRIVER, PLEASE DRIVE IN AN EMPTY PARKING LOT FOR AT LEAST 2-3 TRIALS TO TEST REACTION TIME, VISION, USE OF EQUIPMENT IN CAR, ETC.   IF SUCCESSFUL WITH THE PARKING LOT DRIVING, PROCEED TO SUPERVISED DRIVING TRIALS IN YOUR NEIGHBORHOOD STREETS AT LOW TRAFFIC TIMES TO TEST OBSERVATION TO TRAFFIC SIGNALS, REACTION TIME, ETC. PLEASE ATTEMPT AT LEAST 2-3 TRIALS IN YOUR NEIGHBORHOOD.   IF NEIGHBORHOOD DRIVING IS SUCCESSFUL, YOU MAY PROCEED TO DRIVING IN BUSIER AREAS IN YOUR COMMUNITY WITH SUPERVISION OF A LICENSED DRIVER. PLEASE ATTEMPT AT LEAST 4-5 TRIALS.  FINALLY, YOU MAY GRADUALLY RETURN TO INTERSTATE AND NIGHTTIME DRIVING WITH SUPERVISION.

## 2025-01-11 ENCOUNTER — Ambulatory Visit: Admitting: Occupational Therapy

## 2025-01-11 ENCOUNTER — Ambulatory Visit: Admitting: Physical Therapy

## 2025-01-18 ENCOUNTER — Ambulatory Visit: Admitting: Physical Therapy

## 2025-01-18 ENCOUNTER — Ambulatory Visit: Admitting: Occupational Therapy
